# Patient Record
Sex: Female | Born: 1960 | Race: White | Hispanic: No | Smoking: Never smoker
Health system: Southern US, Community
[De-identification: ages and names within clinical notes are randomized; demographics above are authoritative.]

## PROBLEM LIST (undated history)

## (undated) DIAGNOSIS — J45909 Unspecified asthma, uncomplicated: Secondary | ICD-10-CM

## (undated) DIAGNOSIS — E785 Hyperlipidemia, unspecified: Secondary | ICD-10-CM

## (undated) DIAGNOSIS — I10 Essential (primary) hypertension: Secondary | ICD-10-CM

## (undated) DIAGNOSIS — F419 Anxiety disorder, unspecified: Secondary | ICD-10-CM

## (undated) DIAGNOSIS — E039 Hypothyroidism, unspecified: Secondary | ICD-10-CM

## (undated) DIAGNOSIS — E119 Type 2 diabetes mellitus without complications: Secondary | ICD-10-CM

## (undated) DIAGNOSIS — M1990 Unspecified osteoarthritis, unspecified site: Secondary | ICD-10-CM

## (undated) HISTORY — DX: Unspecified osteoarthritis, unspecified site: M19.90

## (undated) HISTORY — DX: Hypothyroidism, unspecified: E03.9

## (undated) HISTORY — PX: ABDOMINAL HYSTERECTOMY: SHX81

## (undated) HISTORY — DX: Hyperlipidemia, unspecified: E78.5

## (undated) HISTORY — PX: TUBAL LIGATION: SHX77

## (undated) HISTORY — PX: ABLATION: SHX5711

---

## 2007-07-31 ENCOUNTER — Ambulatory Visit: Payer: Self-pay

## 2008-08-11 ENCOUNTER — Ambulatory Visit: Payer: Self-pay

## 2009-09-18 HISTORY — PX: ABDOMINAL HYSTERECTOMY: SHX81

## 2009-09-18 HISTORY — PX: ENDOMETRIAL ABLATION: SHX621

## 2009-09-21 ENCOUNTER — Ambulatory Visit: Payer: Self-pay | Admitting: Family Medicine

## 2010-10-26 ENCOUNTER — Ambulatory Visit: Payer: Self-pay

## 2011-11-30 ENCOUNTER — Ambulatory Visit: Payer: Self-pay | Admitting: Family Medicine

## 2013-03-05 ENCOUNTER — Ambulatory Visit: Payer: Self-pay

## 2014-03-17 ENCOUNTER — Emergency Department: Payer: Self-pay | Admitting: Emergency Medicine

## 2014-03-23 ENCOUNTER — Emergency Department: Payer: Self-pay | Admitting: Emergency Medicine

## 2014-06-10 ENCOUNTER — Ambulatory Visit: Payer: Self-pay

## 2015-03-25 ENCOUNTER — Encounter (INDEPENDENT_AMBULATORY_CARE_PROVIDER_SITE_OTHER): Payer: Self-pay

## 2015-03-25 ENCOUNTER — Ambulatory Visit: Payer: Self-pay

## 2016-10-18 ENCOUNTER — Emergency Department
Admission: EM | Admit: 2016-10-18 | Discharge: 2016-10-18 | Disposition: A | Discharge status: Home / Self Care | Payer: Self-pay | Attending: Emergency Medicine | Admitting: Emergency Medicine

## 2016-10-18 ENCOUNTER — Encounter: Payer: Self-pay | Admitting: Emergency Medicine

## 2016-10-18 ENCOUNTER — Emergency Department: Payer: Self-pay

## 2016-10-18 DIAGNOSIS — M1710 Unilateral primary osteoarthritis, unspecified knee: Secondary | ICD-10-CM

## 2016-10-18 DIAGNOSIS — M1712 Unilateral primary osteoarthritis, left knee: Secondary | ICD-10-CM | POA: Insufficient documentation

## 2016-10-18 DIAGNOSIS — J45909 Unspecified asthma, uncomplicated: Secondary | ICD-10-CM | POA: Insufficient documentation

## 2016-10-18 DIAGNOSIS — E119 Type 2 diabetes mellitus without complications: Secondary | ICD-10-CM | POA: Insufficient documentation

## 2016-10-18 DIAGNOSIS — I10 Essential (primary) hypertension: Secondary | ICD-10-CM | POA: Insufficient documentation

## 2016-10-18 DIAGNOSIS — M25562 Pain in left knee: Secondary | ICD-10-CM

## 2016-10-18 HISTORY — DX: Type 2 diabetes mellitus without complications: E11.9

## 2016-10-18 HISTORY — DX: Unspecified asthma, uncomplicated: J45.909

## 2016-10-18 HISTORY — DX: Essential (primary) hypertension: I10

## 2016-10-18 MED ORDER — MELOXICAM 7.5 MG PO TABS: 7.5000 mg | ORAL_TABLET | Freq: Every day | ORAL | 1 refills | Status: AC

## 2016-10-18 NOTE — ED Triage Notes (Signed)
Pt to ED via POV with c/o left knee pain, states she has hx of injury to extremity. Pt states she felt a "pop" Friday night, states pain radiates down leg. Pt c/o swelling to area. Pt ambulatory to traige. A&Ox4, NAD noted

## 2016-10-18 NOTE — ED Provider Notes (Signed)
[facility] Emergency Department Provider Note  ____________________________________________  Time seen: Approximately 4:25 PM  I have reviewed the triage vital signs and the nursing notes.   HISTORY  Chief Complaint Knee Pain    HPI Rachel Valenzuela is a 56 y.o. female presenting to the emergency department with left 8 out of 10 posterior and medial knee pain. Patient states that knee pain started 3 days ago. Patient states that she "felt a pop" at work on Friday. In early December, patient states that she fell on left knee at Christmas time after being tripped by a dog. She denies prior surgeries to the left lower extremity. Patient denies prior DVT, PE, prolonged immobility, recent surgery, smoking, erythema of the skin overlying the left knee or shortness of breath. She has tried ibuprofen but recalls no other alleviating measures. Patient currently works 2 jobs. 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  Past Medical History:  Diagnosis Date  . Asthma   . Diabetes mellitus without complication ([place])   . Hypertension     There are no active problems to display for this patient.   Past Surgical History:  Procedure Laterality Date  . ABDOMINAL HYSTERECTOMY      Prior to Admission medications   Medication Sig Start Date End Date Taking? Authorizing Provider  meloxicam (MOBIC) 7.5 MG tablet Take 1 tablet (7.5 mg total) by mouth daily. 10/18/16 10/25/16  Lannie Fields, PA-C    Allergies Ramipril  No family history on file.  Social History Social History  Substance Use Topics  . Smoking status: Never Smoker  . Smokeless tobacco: Never Used  . Alcohol use No     Review of Systems  Constitutional: No fever/chills Eyes: No visual changes. No discharge ENT: No upper respiratory complaints. Cardiovascular: no chest pain. Respiratory: no cough. No SOB. Gastrointestinal: No abdominal pain.  No nausea, no vomiting.   No diarrhea.  No constipation. Musculoskeletal: Patient has left knee pain. Skin: Negative for rash, abrasions, lacerations, ecchymosis. Neurological: Negative for headaches, focal weakness or numbness. ____________________________________________   PHYSICAL EXAM:  VITAL SIGNS: ED Triage Vitals  Enc Vitals Group     BP 10/18/16 1325 (!) 166/94     Pulse Rate 10/18/16 1324 92     Resp 10/18/16 1324 16     Temp 10/18/16 1324 99.1 F (37.3 C)     Temp Source 10/18/16 1324 Oral     SpO2 10/18/16 1324 99 %     Weight 10/18/16 1325 194 lb (88 kg)     Height 10/18/16 1325 5\' 1"  (1.549 m)     Head Circumference --      Peak Flow --      Pain Score 10/18/16 1325 0     Pain Loc --      Pain Edu? --      Excl. in [place]? --      Constitutional: Alert and oriented. Well appearing and in no acute distress. Neck: FROM.  Cardiovascular: Normal rate, regular rhythm. Normal S1 and S2.  Good peripheral circulation. Respiratory: Normal respiratory effort without tachypnea or retractions. Lungs CTAB. Good air entry to the bases with no decreased or absent breath sounds. Musculoskeletal: To inspection, left knee appears mildly effused. Patient has 5 out of 5 strength of the lower extremities bilaterally. Patient can perform full extension and flexion at the left knee.Crepitus noted to palpation without increased warmth. Palpable left popliteal fullness. She has pain  to palpation along the medial joint line. Negative ballottement, left. Negative anterior and posterior drawer test, left. Some pain is elicited with lateral collateral ligament testing, left.  Neurologic:  Normal speech and language. No gross focal neurologic deficits are appreciated.  Skin:  Skin is warm, dry and intact. No erythema or edema of the skin overlying the left knee. Psychiatric: Mood and affect are normal. Speech and behavior are normal. Patient exhibits appropriate insight and judgement.  ___________________________________    LABS (all labs ordered are listed, but only abnormal results are displayed)  Labs Reviewed - No data to display ____________________________________________  EKG   ____________________________________________  RADIOLOGY Unk Pinto, personally viewed and evaluated these images (plain radiographs) as part of my medical decision making, as well as reviewing the written report by the radiologist.   Dg Knee Complete 4 Views Left  Result Date: 10/18/2016 CLINICAL DATA:  Left knee pain, popping and swelling. EXAM: LEFT KNEE - COMPLETE 4+ VIEW COMPARISON:  None. FINDINGS: Small knee joint effusion. Tricompartmental osteoarthritis with joint space narrowing, most pronounced in the medial compartment. Multiple intra-articular loose bodies. No evidence of fracture. 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 Electronically Signed   By: Nelson Chimes M.D.   On: 10/18/2016 16:41    ____________________________________________    PROCEDURES  Procedure(s) performed:    Procedures    Medications - No data to display   ____________________________________________   INITIAL IMPRESSION / ASSESSMENT AND PLAN / ED COURSE  Pertinent labs & imaging results that were available during my care of the patient were reviewed by me and considered in my medical decision making (see chart for details).  Review of the [place] CSRS was performed in accordance of the [place] prior to dispensing any controlled drugs.     Assessment and Plan: Left Knee Pain Patient presents the emergency department with left knee pain for the past 3 days. DG left knee reveals tricompartmental arthritis with several loose bodies. No acute fractures were visualized. Patient was referred to orthopedics, Dr. Mack Guise. She was discharged with Mobic. All patient questions were answered. ____________________________________________  FINAL CLINICAL IMPRESSION(S) / ED  DIAGNOSES  Final diagnoses:  Acute pain of left knee  Arthritis of knee      NEW MEDICATIONS STARTED DURING THIS VISIT:  New Prescriptions   MELOXICAM (MOBIC) 7.5 MG TABLET    Take 1 tablet (7.5 mg total) by mouth daily.        This chart was dictated using voice recognition software/Dragon. Despite best efforts to proofread, errors can occur which can change the meaning. Any change was purely unintentional.    Lannie Fields, PA-C 10/18/16 [place], MD 10/18/16 [phone]

## 2016-10-18 NOTE — ED Notes (Signed)
See triage note   States she fell couple of months ago  Then felt a pop in left knee   Ambulates with limp d/t pain in left leg

## 2016-11-20 ENCOUNTER — Telehealth: Payer: Self-pay | Admitting: Pharmacist

## 2016-11-20 NOTE — Telephone Encounter (Signed)
11/20/16 Called [facility] for refill on [employer]

## 2016-11-22 ENCOUNTER — Telehealth: Payer: Self-pay | Admitting: Pharmacist

## 2016-11-22 NOTE — Telephone Encounter (Signed)
Called [facility] for refill on [employer]

## 2016-12-29 ENCOUNTER — Telehealth: Payer: Self-pay | Admitting: Pharmacist

## 2016-12-29 NOTE — Telephone Encounter (Signed)
12/29/16 Faxed refill request to [facility] for Prozac 40mg  (1 daily).

## 2017-03-14 ENCOUNTER — Telehealth: Payer: Self-pay | Admitting: Pharmacy Technician

## 2017-03-14 NOTE — Telephone Encounter (Signed)
Patient eligible to receive medication assistance at [facility] until 11/16/17 as long as eligibility requirements continue to be met.  Rachel Valenzuela [facility]

## 2017-03-15 ENCOUNTER — Telehealth: Payer: Self-pay | Admitting: Pharmacist

## 2017-03-15 NOTE — Telephone Encounter (Signed)
03/15/17 Fax refill request to [facility] for Prozac 40mg  take one capsule by mouth daily.Delos Haring

## 2017-07-23 ENCOUNTER — Other Ambulatory Visit: Payer: Self-pay | Admitting: Family Medicine

## 2017-07-23 DIAGNOSIS — Z1239 Encounter for other screening for malignant neoplasm of breast: Secondary | ICD-10-CM

## 2017-08-29 ENCOUNTER — Ambulatory Visit
Admission: RE | Admit: 2017-08-29 | Discharge: 2017-08-29 | Disposition: A | Discharge status: Home / Self Care | Payer: Medicaid Other | Source: Ambulatory Visit | Attending: Family Medicine | Admitting: Family Medicine

## 2017-08-29 DIAGNOSIS — Z1231 Encounter for screening mammogram for malignant neoplasm of breast: Secondary | ICD-10-CM | POA: Diagnosis not present

## 2017-08-29 DIAGNOSIS — R928 Other abnormal and inconclusive findings on diagnostic imaging of breast: Secondary | ICD-10-CM | POA: Insufficient documentation

## 2017-08-29 DIAGNOSIS — Z1239 Encounter for other screening for malignant neoplasm of breast: Secondary | ICD-10-CM

## 2017-09-04 ENCOUNTER — Other Ambulatory Visit: Payer: Self-pay | Admitting: Family Medicine

## 2017-09-04 DIAGNOSIS — R921 Mammographic calcification found on diagnostic imaging of breast: Secondary | ICD-10-CM

## 2017-09-04 DIAGNOSIS — R928 Other abnormal and inconclusive findings on diagnostic imaging of breast: Secondary | ICD-10-CM

## 2017-09-12 ENCOUNTER — Telehealth: Payer: Self-pay | Admitting: Pharmacy Technician

## 2017-09-12 NOTE — Telephone Encounter (Signed)
Patient failed to provide current poi.  No additional medication assistance will be provided by [facility] without the required proof of income documentation.  Patient notified by letter.  Lavana Huckeba J. Tamico Mundo Care Manager [facility] 

## 2017-09-13 ENCOUNTER — Ambulatory Visit
Admission: RE | Admit: 2017-09-13 | Discharge: 2017-09-13 | Disposition: A | Discharge status: Home / Self Care | Payer: Medicaid Other | Source: Ambulatory Visit | Attending: Family Medicine | Admitting: Family Medicine

## 2017-09-13 DIAGNOSIS — R921 Mammographic calcification found on diagnostic imaging of breast: Secondary | ICD-10-CM | POA: Diagnosis not present

## 2017-09-13 DIAGNOSIS — R928 Other abnormal and inconclusive findings on diagnostic imaging of breast: Secondary | ICD-10-CM

## 2017-10-17 DIAGNOSIS — E669 Obesity, unspecified: Secondary | ICD-10-CM | POA: Insufficient documentation

## 2017-10-17 DIAGNOSIS — E119 Type 2 diabetes mellitus without complications: Secondary | ICD-10-CM | POA: Insufficient documentation

## 2017-11-06 DIAGNOSIS — M1711 Unilateral primary osteoarthritis, right knee: Secondary | ICD-10-CM | POA: Insufficient documentation

## 2018-04-10 ENCOUNTER — Other Ambulatory Visit: Payer: Self-pay | Admitting: Family Medicine

## 2018-04-10 DIAGNOSIS — R928 Other abnormal and inconclusive findings on diagnostic imaging of breast: Secondary | ICD-10-CM

## 2018-04-30 ENCOUNTER — Ambulatory Visit
Admission: RE | Admit: 2018-04-30 | Discharge: 2018-04-30 | Disposition: A | Discharge status: Home / Self Care | Payer: Medicaid Other | Source: Ambulatory Visit | Attending: Family Medicine | Admitting: Family Medicine

## 2018-04-30 DIAGNOSIS — R928 Other abnormal and inconclusive findings on diagnostic imaging of breast: Secondary | ICD-10-CM | POA: Diagnosis not present

## 2019-04-22 ENCOUNTER — Other Ambulatory Visit: Payer: Self-pay | Admitting: Family Medicine

## 2019-04-22 DIAGNOSIS — R921 Mammographic calcification found on diagnostic imaging of breast: Secondary | ICD-10-CM

## 2019-09-15 ENCOUNTER — Ambulatory Visit
Admission: RE | Admit: 2019-09-15 | Discharge: 2019-09-15 | Disposition: A | Discharge status: Home / Self Care | Payer: Medicare Other | Source: Ambulatory Visit | Attending: Family Medicine | Admitting: Family Medicine

## 2019-09-15 DIAGNOSIS — R921 Mammographic calcification found on diagnostic imaging of breast: Secondary | ICD-10-CM

## 2020-07-07 ENCOUNTER — Other Ambulatory Visit: Payer: Self-pay | Admitting: Family Medicine

## 2020-07-07 DIAGNOSIS — Z1231 Encounter for screening mammogram for malignant neoplasm of breast: Secondary | ICD-10-CM

## 2020-10-20 ENCOUNTER — Ambulatory Visit
Admission: RE | Admit: 2020-10-20 | Discharge: 2020-10-20 | Disposition: A | Discharge status: Home / Self Care | Payer: Medicare Other | Source: Ambulatory Visit | Attending: Family Medicine | Admitting: Family Medicine

## 2020-10-20 ENCOUNTER — Other Ambulatory Visit: Payer: Self-pay

## 2020-10-20 DIAGNOSIS — Z1231 Encounter for screening mammogram for malignant neoplasm of breast: Secondary | ICD-10-CM | POA: Diagnosis not present

## 2021-01-07 IMAGING — MG DIGITAL DIAGNOSTIC BILAT W/ TOMO W/ CAD
8 of 18 series · 8 of 40 positions shown · non-contrast
Comparison: Previous exam(s).

CLINICAL DATA: 58-year-old female for 2 year follow-up of RIGHT
breast calcifications and for annual bilateral mammogram.

EXAM:
DIGITAL DIAGNOSTIC BILATERAL MAMMOGRAM WITH CAD AND TOMO

[R ML]
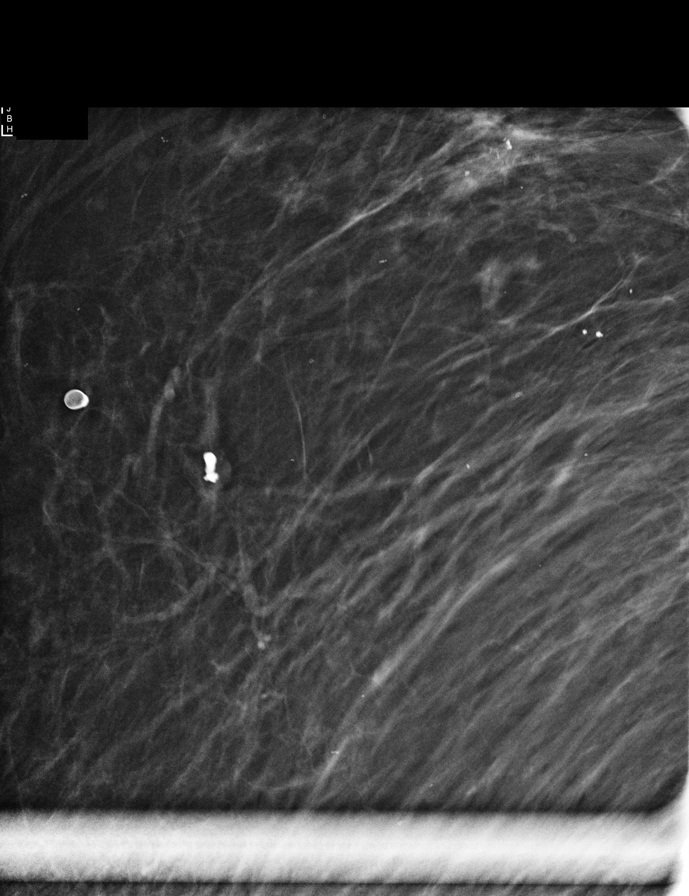

[L XCCL synth-2D]
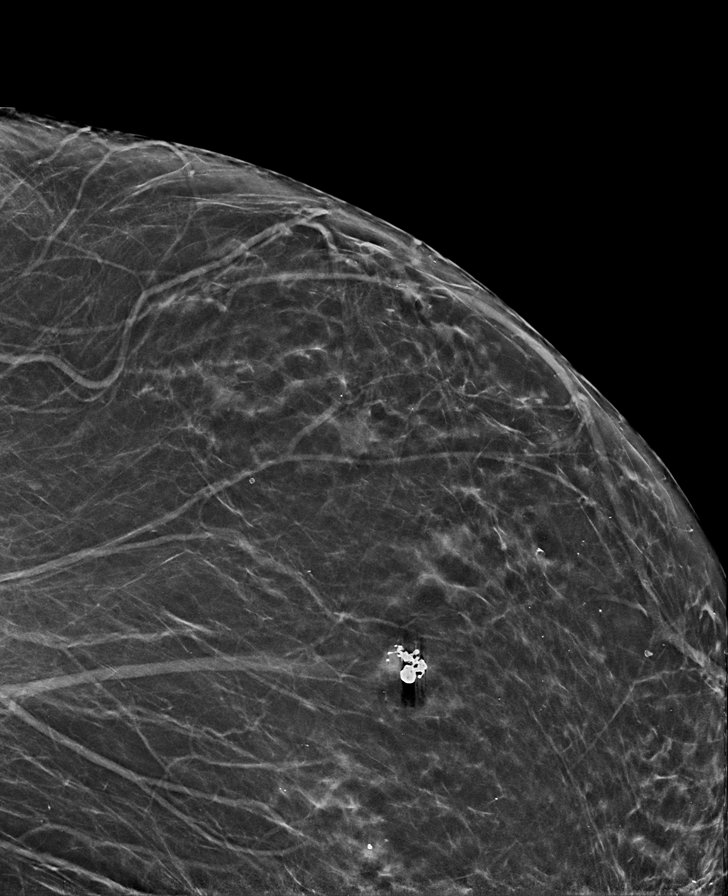

[L MLO synth-2D (1 of 2)]
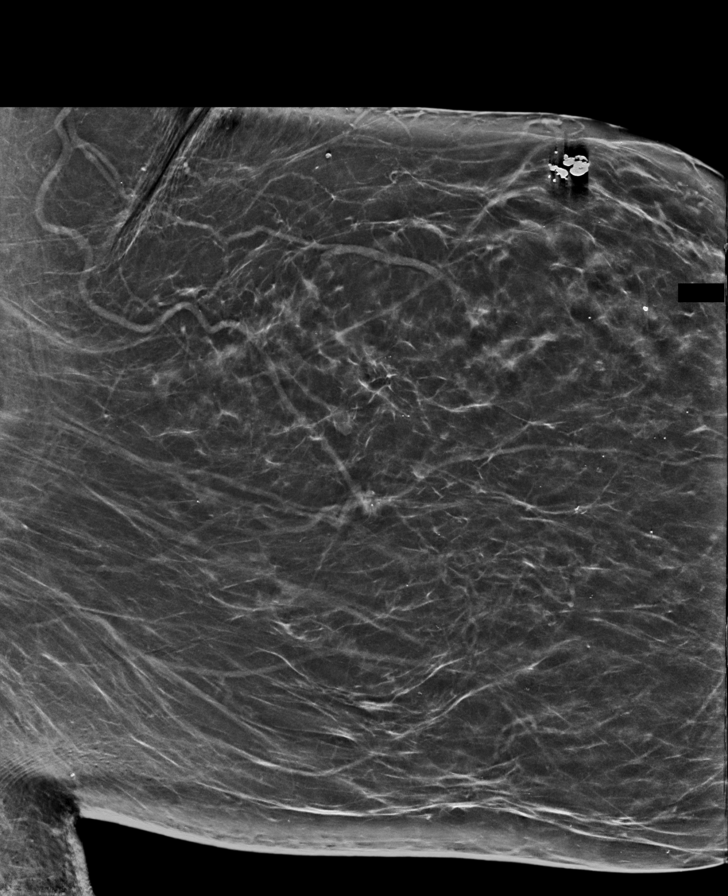

[L MLO synth-2D (2 of 2)]
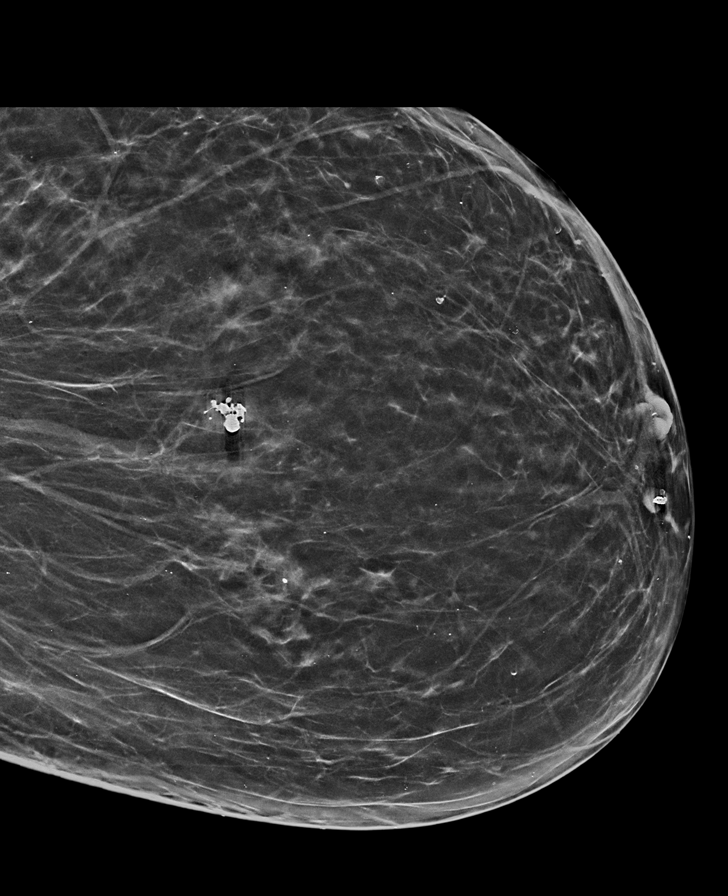

[L CC synth-2D (1 of 2)]
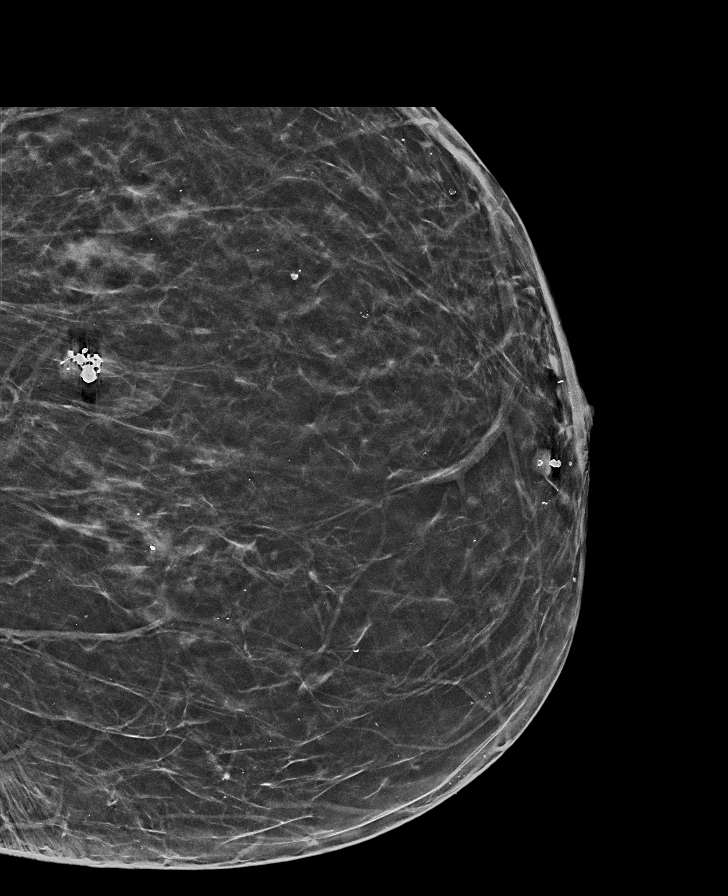

[L CC synth-2D (2 of 2)]
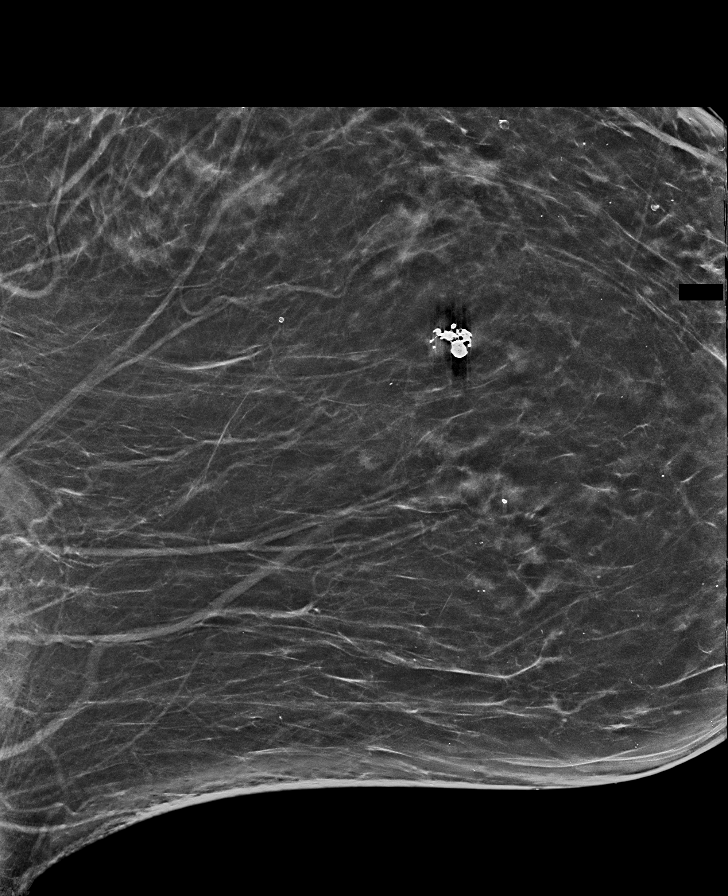

[R XCCL synth-2D]
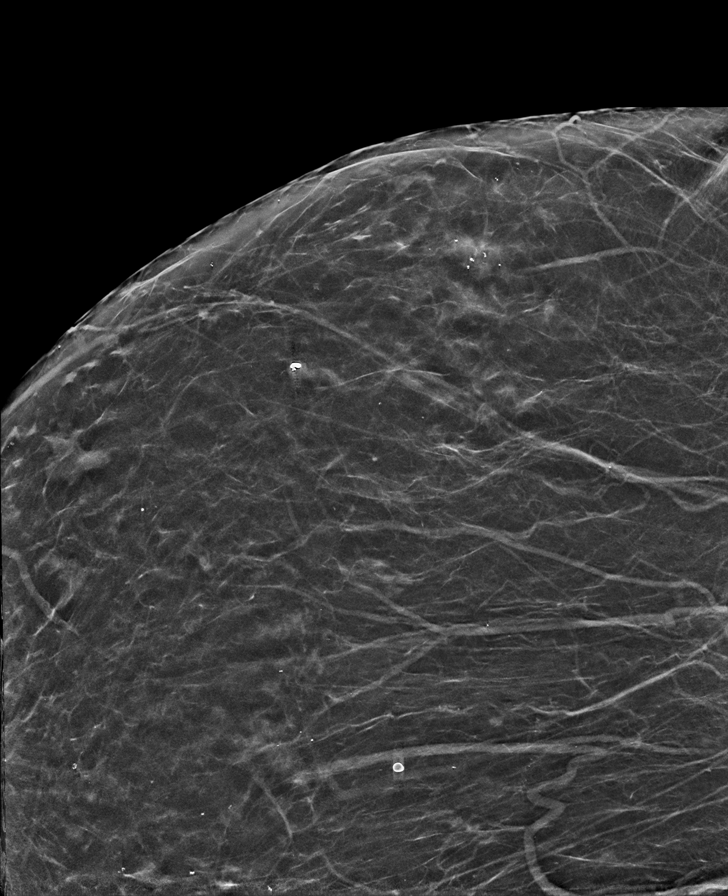

[R MLO synth-2D]
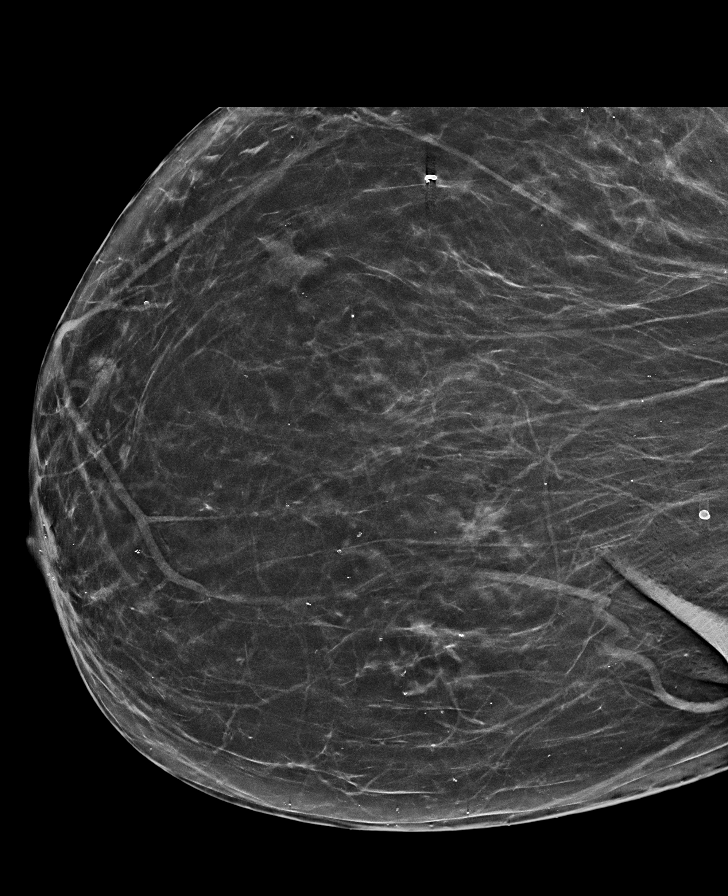

[8 of 40 positions shown; findings below may reference images not displayed]

ACR Breast Density Category b: There are scattered areas of
fibroglandular density.
FINDINGS: 2D/3D full field views of both breasts and magnification views of
the RIGHT breast demonstrate an unchanged 0.8 cm group of
calcifications within the posterior UPPER-OUTER RIGHT breast.

There are no findings suspicious for malignancy within either
breast.

Mammographic images were processed with CAD.
IMPRESSION: 1. Stable calcifications within the UPPER-OUTER RIGHT breast,
unchanged for 2 years and considered benign.
2. No mammographic evidence of breast malignancy.

RECOMMENDATION:
Bilateral screening mammogram in 1 year.

I have discussed the findings and recommendations with the patient.
If applicable, a reminder letter will be sent to the patient
regarding the next appointment.

BI-RADS CATEGORY  2: Benign.

## 2021-03-29 ENCOUNTER — Ambulatory Visit (INDEPENDENT_AMBULATORY_CARE_PROVIDER_SITE_OTHER): Payer: Medicare Other | Admitting: Urology

## 2021-03-29 ENCOUNTER — Encounter: Payer: Self-pay | Admitting: Urology

## 2021-03-29 ENCOUNTER — Other Ambulatory Visit: Payer: Self-pay | Admitting: *Deleted

## 2021-03-29 ENCOUNTER — Other Ambulatory Visit
Admission: RE | Admit: 2021-03-29 | Discharge: 2021-03-29 | Disposition: A | Discharge status: Home / Self Care | Payer: Medicare Other | Attending: Urology | Admitting: Urology

## 2021-03-29 ENCOUNTER — Other Ambulatory Visit: Payer: Self-pay

## 2021-03-29 VITALS — BP 171/91 | HR 86 | Ht 62.0 in | Wt 202.0 lb

## 2021-03-29 DIAGNOSIS — A599 Trichomoniasis, unspecified: Secondary | ICD-10-CM

## 2021-03-29 DIAGNOSIS — N390 Urinary tract infection, site not specified: Secondary | ICD-10-CM

## 2021-03-29 DIAGNOSIS — N3281 Overactive bladder: Secondary | ICD-10-CM | POA: Diagnosis not present

## 2021-03-29 LAB — URINALYSIS, COMPLETE (UACMP) WITH MICROSCOPIC
Bilirubin Urine: NEGATIVE
Glucose, UA: 500 mg/dL — AB
Ketones, ur: NEGATIVE mg/dL
Nitrite: NEGATIVE
Protein, ur: 30 mg/dL — AB
Specific Gravity, Urine: 1.025 (ref 1.005–1.030)
WBC, UA: >50 WBC/hpf (ref 0–5)
pH: 6 (ref 5.0–8.0)

## 2021-03-29 MED ORDER — METRONIDAZOLE 500 MG PO TABS: 2000.0000 mg | ORAL_TABLET | Freq: Three times a day (TID) | ORAL | 0 refills | Status: DC

## 2021-03-29 NOTE — Patient Instructions (Signed)

## 2021-03-29 NOTE — Progress Notes (Signed)
   03/29/21 2:12 PM   Rachel Valenzuela 09-Jun-1961 [id number]  CC: Urinary urgency and frequency  HPI: I saw Rachel Valenzuela in urology clinic for the above issues.  She reports 4 to 5 months of urinary urgency and frequency during the day and night.  She denies any gross hematuria or dysuria.  She has not been sexually active recently.  Urine culture reportedly showed mixed species with her PCP, and she was prescribed antibiotics without any improvement in her urinary symptoms.  She has poorly controlled diabetes with a hemoglobin A1c of 9.3.  She drinks water and diet tea.  Urinalysis today few bacteria, 6-10 RBCs, 11-20 squamous cells, trichomonas present, greater than 50 WBCs, 500 glucose.  Will send for culture.  PMH: Past Medical History:  Diagnosis Date   Asthma    Diabetes mellitus without complication ([place])    Hypertension     Surgical History: Past Surgical History:  Procedure Laterality Date   ABDOMINAL HYSTERECTOMY      Family History: Family History  Problem Relation Age of Onset   Breast cancer Neg Hx     Social History:  reports that she has never smoked. She has never used smokeless tobacco. She reports that she does not drink alcohol and does not use drugs.  Physical Exam: BP (!) 171/91   Pulse 86   Ht 5\' 2"  (1.575 m)   Wt 202 lb (91.6 kg)   BMI 36.95 kg/m    Constitutional:  Alert and oriented, No acute distress. Cardiovascular: No clubbing, cyanosis, or edema. Respiratory: Normal respiratory effort, no increased work of breathing.  Laboratory Data: Reviewed, see HPI  Assessment & Plan:   60 year old female with urinary urgency and frequency likely secondary to trichomonas infection.  We discussed other possible etiologies including poorly controlled diabetes with glucosuria, overactive bladder, or atypical infection.  Will follow-up culture results.  -Metronidazole 2 g x 1 for trichomonas STI -Behavioral strategies discussed at length regarding diabetes  and overactive bladder -RTC 3 to 4 weeks symptom check, consider OAB medication if persistent symptoms  Nickolas Madrid, MD 03/29/2021  [facility] [address] [place], [place] [address] [phone]

## 2021-03-30 ENCOUNTER — Telehealth: Payer: Self-pay

## 2021-03-30 LAB — MISC LABCORP TEST (SEND OUT): LabCorp test name: 86884

## 2021-03-30 NOTE — Telephone Encounter (Signed)
Incoming call from [facility] stating they need clarification on RX for flagyl. Informed pharmacist that Flagyl RX should be for a one time dose of 4 tablets. RX changed.

## 2021-03-31 LAB — URINE CULTURE

## 2021-04-07 ENCOUNTER — Telehealth: Payer: Self-pay

## 2021-04-07 DIAGNOSIS — Z2239 Carrier of other specified bacterial diseases: Secondary | ICD-10-CM

## 2021-04-07 DIAGNOSIS — B3731 Acute candidiasis of vulva and vagina: Secondary | ICD-10-CM

## 2021-04-07 DIAGNOSIS — B373 Candidiasis of vulva and vagina: Secondary | ICD-10-CM

## 2021-04-07 MED ORDER — FLUCONAZOLE 150 MG PO TABS: ORAL_TABLET | ORAL | 0 refills | Status: DC

## 2021-04-07 MED ORDER — DOXYCYCLINE HYCLATE 100 MG PO CAPS: 100.0000 mg | ORAL_CAPSULE | Freq: Two times a day (BID) | ORAL | 0 refills | Status: AC

## 2021-04-07 NOTE — Telephone Encounter (Signed)
Called pt informed her of the information below. Pt gave verbal understanding. RX sent. Additionally pt requests diflucan for vaginal yeast infection after several courses of antibiotics. RX sent.

## 2021-04-07 NOTE — Telephone Encounter (Signed)
-----   Message from Billey Co, MD sent at 04/06/2021  3:08 PM EDT ----- Regarding: abx for ureaplasma Let do doxycycline 100mg  BID x 10 days for the Ureaplasma UTI, thanks  Nickolas Madrid, MD 04/06/2021

## 2021-04-21 ENCOUNTER — Ambulatory Visit (INDEPENDENT_AMBULATORY_CARE_PROVIDER_SITE_OTHER): Payer: Medicare Other | Admitting: Physician Assistant

## 2021-04-21 ENCOUNTER — Other Ambulatory Visit: Payer: Self-pay

## 2021-04-21 VITALS — BP 195/101 | HR 98 | Ht 62.0 in | Wt 200.0 lb

## 2021-04-21 DIAGNOSIS — R350 Frequency of micturition: Secondary | ICD-10-CM

## 2021-04-21 LAB — BLADDER SCAN AMB NON-IMAGING

## 2021-04-21 MED ORDER — MIRABEGRON ER 25 MG PO TB24: 25.0000 mg | ORAL_TABLET | Freq: Every day | ORAL | 0 refills | Status: DC

## 2021-04-21 NOTE — Progress Notes (Signed)
04/21/2021 2:32 PM   Rachel Valenzuela [birth year] [id number]  CC: Chief Complaint  Patient presents with   Follow-up   HPI: Rachel Valenzuela is a 60 y.o. female with PMH poorly controlled diabetes, urgency and frequency who was recently treated for trichomonas and Ureaplasma UTI who presents today for symptom recheck.   Today she reports no improvement in her urinary symptoms despite treatment as above.  She continues to report urinary frequency every 1-2 hours throughout the day and night associated with urgency and insensate leakage, for which she wears liners daily.  She denies bulging or pressure in the vagina.  She states her urinary symptoms started within the last 6 to 12 months and are not persistent, as she will have some days where she does not have urgency.  She does not think her earlier trichomonas diagnosis was accurate as she had not been sexually active in 1 year.  In-office UA today positive for 2+ glucose; urine microscopy pan negative. PVR 22m.  PMH: Past Medical History:  Diagnosis Date   Asthma    Diabetes mellitus without complication ([place]    Hypertension     Surgical History: Past Surgical History:  Procedure Laterality Date   ABDOMINAL HYSTERECTOMY      Home Medications:  Allergies as of 04/21/2021       Reactions   Ramipril Anaphylaxis, Hives   Sulfa Antibiotics    Other reaction(s): Other (See Comments)        Medication List        Accurate as of April 21, 2021  2:32 PM. If you have any questions, ask your nurse or doctor.          amLODipine 10 MG tablet Commonly known as: NORVASC Take 10 mg by mouth daily.   atenolol 100 MG tablet Commonly known as: TENORMIN Take 100 mg by mouth daily.   atorvastatin 80 MG tablet Commonly known as: LIPITOR Take by mouth daily.   azelastine 0.1 % nasal spray Commonly known as: ASTELIN SMARTSIG:1-2 Puff(s) Both Nares Twice Daily   Breo Ellipta 200-25 MCG/INH Aepb Generic drug: fluticasone  furoate-vilanterol Inhale 1 puff into the lungs daily.   cyclobenzaprine 5 MG tablet Commonly known as: FLEXERIL Take 5 mg by mouth 3 (three) times daily as needed for muscle spasms.   fluconazole 150 MG tablet Commonly known as: DIFLUCAN Take 1 tablet by mouth with first dose of antibiotics, take second tablet with last dose   FLUoxetine 40 MG capsule Commonly known as: PROZAC Take 40 mg by mouth daily.   hydrALAZINE 100 MG tablet Commonly known as: APRESOLINE Take 100 mg by mouth 2 (two) times daily.   ibuprofen 800 MG tablet Commonly known as: ADVIL Take by mouth as needed.   Januvia 100 MG tablet Generic drug: sitaGLIPtin Take 100 mg by mouth daily.   latanoprost 0.005 % ophthalmic solution Commonly known as: XALATAN 1 drop 2 (two) times daily.   levocetirizine 5 MG tablet Commonly known as: XYZAL Take 5 mg by mouth daily.   levothyroxine 150 MCG tablet Commonly known as: SYNTHROID Take 150 mcg by mouth daily.   meclizine 25 MG tablet Commonly known as: ANTIVERT Take by mouth 3 (three) times daily as needed.   meloxicam 15 MG tablet Commonly known as: MOBIC Take 15 mg by mouth daily.   metroNIDAZOLE 500 MG tablet Commonly known as: FLAGYL Take 4 tablets (2,000 mg total) by mouth 3 (three) times daily.   mirabegron ER 25 MG Tb24  tablet Commonly known as: MYRBETRIQ Take 1 tablet (25 mg total) by mouth daily. Started by: Debroah Loop, PA-C   montelukast 10 MG tablet Commonly known as: SINGULAIR Take 10 mg by mouth daily.   ProAir HFA 108 (90 Base) MCG/ACT inhaler Generic drug: albuterol Inhale into the lungs as needed.   Simbrinza 1-0.2 % Susp Generic drug: Brinzolamide-Brimonidine Apply 1 drop to eye 2 (two) times daily.   traZODone 100 MG tablet Commonly known as: DESYREL Take 200 mg by mouth at bedtime as needed.   Trulicity 1.5 0000000 Sopn Generic drug: Dulaglutide Inject into the skin once a week.   Vitamin D 50 MCG (2000  UT) Caps Take by mouth daily.        Allergies:  Allergies  Allergen Reactions   Ramipril Anaphylaxis and Hives   Sulfa Antibiotics     Other reaction(s): Other (See Comments)    Family History: Family History  Problem Relation Age of Onset   Breast cancer Neg Hx     Social History:   reports that she has never smoked. She has never used smokeless tobacco. She reports that she does not drink alcohol and does not use drugs.  Physical Exam: BP (!) 195/101   Pulse 98   Ht '5\' 2"'$  (1.575 m)   Wt 200 lb (90.7 kg)   BMI 36.58 kg/m   Constitutional:  Alert and oriented, no acute distress, nontoxic appearing HEENT: Rachel Valenzuela, AT Cardiovascular: No clubbing, cyanosis, or edema Respiratory: Normal respiratory effort, no increased work of breathing Skin: No rashes, bruises or suspicious lesions Neurologic: Grossly intact, no focal deficits, moving all 4 extremities Psychiatric: Normal mood and affect  Laboratory Data: Results for orders placed or performed in visit on 04/21/21  Microscopic Examination   Urine  Result Value Ref Range   WBC, UA 0-5 0 - 5 /hpf   RBC 0-2 0 - 2 /hpf   Epithelial Cells (non renal) 0-10 0 - 10 /hpf   Bacteria, UA Few None seen/Few  Urinalysis, Complete  Result Value Ref Range   Specific Gravity, UA 1.025 1.005 - 1.030   pH, UA 6.0 5.0 - 7.5   Color, UA Yellow Yellow   Appearance Ur Hazy (A) Clear   Leukocytes,UA Negative Negative   Protein,UA Negative Negative/Trace   Glucose, UA 2+ (A) Negative   Ketones, UA Negative Negative   RBC, UA Negative Negative   Bilirubin, UA Negative Negative   Urobilinogen, Ur 0.2 0.2 - 1.0 mg/dL   Nitrite, UA Negative Negative   Microscopic Examination See below:   Bladder Scan (Post Void Residual) in office  Result Value Ref Range   Scan Result 65m    Assessment & Plan:   1. Urinary frequency No symptomatic improvement despite appropriate therapy for trichomonas and Ureaplasma UTI.  Notably, UA is benign  today and she is emptying appropriately.  I offered her a trial of Myrbetriq 25 mg today, which she accepted.  We will plan for symptom recheck and PVR in 4 weeks. - Urinalysis, Complete - Bladder Scan (Post Void Residual) in office - mirabegron ER (MYRBETRIQ) 25 MG TB24 tablet; Take 1 tablet (25 mg total) by mouth daily.  Dispense: 28 tablet; Refill: 0  Return in about 4 weeks (around 05/19/2021) for Symptom recheck with PVR.  SDebroah Loop PA-C  [facility] [address] [place] [phone]

## 2021-04-22 LAB — MICROSCOPIC EXAMINATION

## 2021-04-22 LAB — URINALYSIS, COMPLETE
Bilirubin, UA: NEGATIVE
Ketones, UA: NEGATIVE
Leukocytes,UA: NEGATIVE
Nitrite, UA: NEGATIVE
Protein,UA: NEGATIVE
RBC, UA: NEGATIVE
Specific Gravity, UA: 1.025 (ref 1.005–1.030)
Urobilinogen, Ur: 0.2 mg/dL (ref 0.2–1.0)
pH, UA: 6 (ref 5.0–7.5)

## 2021-05-19 ENCOUNTER — Other Ambulatory Visit: Payer: Self-pay

## 2021-05-19 ENCOUNTER — Ambulatory Visit (INDEPENDENT_AMBULATORY_CARE_PROVIDER_SITE_OTHER): Payer: Medicare Other | Admitting: Physician Assistant

## 2021-05-19 VITALS — BP 95/59 | HR 113 | Ht 62.0 in | Wt 202.0 lb

## 2021-05-19 DIAGNOSIS — R350 Frequency of micturition: Secondary | ICD-10-CM

## 2021-05-19 LAB — BLADDER SCAN AMB NON-IMAGING

## 2021-05-19 MED ORDER — MIRABEGRON ER 25 MG PO TB24: 25.0000 mg | ORAL_TABLET | Freq: Every day | ORAL | 11 refills | Status: DC

## 2021-05-19 NOTE — Progress Notes (Signed)
05/19/2021 5:30 PM   Rachel Valenzuela July 17, 1961 [id number]  CC: Chief Complaint  Patient presents with   Urinary Frequency   HPI: Rachel Valenzuela is a 60 y.o. female with PMH poorly controlled diabetes, urgency, and frequency recently treated for trichomonas and Ureaplasma UTI who presents today for symptom recheck on Myrbetriq 25 mg daily.  Today she reports she feels the Myrbetriq improved her urinary symptoms on some days, but others she felt back to baseline.  At her best, she states her daytime frequency reduced from every 1-2 hours to every 4-5 hours.  Her nocturia has improved from every 1-2 hours to 1-2 times nightly.  She has noted some intermittent lower abdominal pressure and denies constipation.  Per chart review she is s/p abdominal hysterectomy, date unknown.  PVR 113 mL.  Notably, patient was noted to be hypotensive in clinic today.  She reports she started a new antihypertensive medication this morning but otherwise feels well.  PMH: Past Medical History:  Diagnosis Date   Asthma    Diabetes mellitus without complication ([place])    Hypertension     Surgical History: Past Surgical History:  Procedure Laterality Date   ABDOMINAL HYSTERECTOMY      Home Medications:  Allergies as of 05/19/2021       Reactions   Ramipril Anaphylaxis, Hives   Sulfa Antibiotics    Other reaction(s): Other (See Comments)        Medication List        Accurate as of May 19, 2021  5:30 PM. If you have any questions, ask your nurse or doctor.          amLODipine 10 MG tablet Commonly known as: NORVASC Take 10 mg by mouth daily.   atenolol 100 MG tablet Commonly known as: TENORMIN Take 100 mg by mouth daily.   atorvastatin 80 MG tablet Commonly known as: LIPITOR Take by mouth daily.   azelastine 0.1 % nasal spray Commonly known as: ASTELIN SMARTSIG:1-2 Puff(s) Both Nares Twice Daily   Breo Ellipta 200-25 MCG/INH Aepb Generic drug: fluticasone  furoate-vilanterol Inhale 1 puff into the lungs daily.   cyclobenzaprine 5 MG tablet Commonly known as: FLEXERIL Take 5 mg by mouth 3 (three) times daily as needed for muscle spasms.   fluconazole 150 MG tablet Commonly known as: DIFLUCAN Take 1 tablet by mouth with first dose of antibiotics, take second tablet with last dose   FLUoxetine 40 MG capsule Commonly known as: PROZAC Take 40 mg by mouth daily.   hydrALAZINE 100 MG tablet Commonly known as: APRESOLINE Take 100 mg by mouth 2 (two) times daily.   ibuprofen 800 MG tablet Commonly known as: ADVIL Take by mouth as needed.   Januvia 100 MG tablet Generic drug: sitaGLIPtin Take 100 mg by mouth daily.   latanoprost 0.005 % ophthalmic solution Commonly known as: XALATAN 1 drop 2 (two) times daily.   levocetirizine 5 MG tablet Commonly known as: XYZAL Take 5 mg by mouth daily.   levothyroxine 150 MCG tablet Commonly known as: SYNTHROID Take 150 mcg by mouth daily.   meclizine 25 MG tablet Commonly known as: ANTIVERT Take by mouth 3 (three) times daily as needed.   meloxicam 15 MG tablet Commonly known as: MOBIC Take 15 mg by mouth daily.   metroNIDAZOLE 500 MG tablet Commonly known as: FLAGYL Take 4 tablets (2,000 mg total) by mouth 3 (three) times daily.   mirabegron ER 25 MG Tb24 tablet Commonly known as: MYRBETRIQ Take  1 tablet (25 mg total) by mouth daily.   montelukast 10 MG tablet Commonly known as: SINGULAIR Take 10 mg by mouth daily.   ProAir HFA 108 (90 Base) MCG/ACT inhaler Generic drug: albuterol Inhale into the lungs as needed.   Simbrinza 1-0.2 % Susp Generic drug: Brinzolamide-Brimonidine Apply 1 drop to eye 2 (two) times daily.   traZODone 100 MG tablet Commonly known as: DESYREL Take 200 mg by mouth at bedtime as needed.   Trulicity 1.5 0000000 Sopn Generic drug: Dulaglutide Inject into the skin once a week.   Vitamin D 50 MCG (2000 UT) Caps Take by mouth daily.         Allergies:  Allergies  Allergen Reactions   Ramipril Anaphylaxis and Hives   Sulfa Antibiotics     Other reaction(s): Other (See Comments)    Family History: Family History  Problem Relation Age of Onset   Breast cancer Neg Hx     Social History:   reports that she has never smoked. She has never used smokeless tobacco. She reports that she does not drink alcohol and does not use drugs.  Physical Exam: BP (!) 95/59   Pulse (!) 113   Ht '5\' 2"'$  (1.575 m)   Wt 202 lb (91.6 kg)   BMI 36.95 kg/m   Constitutional:  Alert and oriented, no acute distress, nontoxic appearing HEENT: [place], AT Cardiovascular: No clubbing, cyanosis, or edema Respiratory: Normal respiratory effort, no increased work of breathing Skin: No rashes, bruises or suspicious lesions Neurologic: Grossly intact, no focal deficits, moving all 4 extremities Psychiatric: Normal mood and affect  Laboratory Data: Results for orders placed or performed in visit on 05/19/21  Bladder Scan (Post Void Residual) in office  Result Value Ref Range   Scan Result 138m    Assessment & Plan:   1. Urinary frequency Symptomatic improvement on Myrbetriq 25 mg daily, but patient is not yet at treatment goal.  We discussed continuing Myrbetriq 25 mg to see if her symptoms continue to improve versus increasing the dose, however given her slight increase in PVR today over prior, I recommended the former.  Patient is in agreement with this plan.  We will plan to recheck her symptoms in 3 months.  If still not at treatment goal, will increase the dose to 50 mg. - Bladder Scan (Post Void Residual) in office - mirabegron ER (MYRBETRIQ) 25 MG TB24 tablet; Take 1 tablet (25 mg total) by mouth daily.  Dispense: 30 tablet; Refill: 11  Return in about 3 months (around 08/18/2021) for Symptom recheck with PVR.  SDebroah Loop PA-C  [facility] [address] [place] [phone]

## 2021-06-15 ENCOUNTER — Other Ambulatory Visit: Payer: Self-pay | Admitting: Family Medicine

## 2021-06-15 DIAGNOSIS — E782 Mixed hyperlipidemia: Secondary | ICD-10-CM

## 2021-06-15 DIAGNOSIS — E118 Type 2 diabetes mellitus with unspecified complications: Secondary | ICD-10-CM

## 2021-06-15 DIAGNOSIS — I10 Essential (primary) hypertension: Secondary | ICD-10-CM

## 2021-06-23 ENCOUNTER — Other Ambulatory Visit: Payer: Self-pay

## 2021-06-23 ENCOUNTER — Ambulatory Visit
Admission: RE | Admit: 2021-06-23 | Discharge: 2021-06-23 | Disposition: A | Discharge status: Home / Self Care | Payer: Medicare Other | Source: Ambulatory Visit | Attending: Family Medicine | Admitting: Family Medicine

## 2021-06-23 DIAGNOSIS — E118 Type 2 diabetes mellitus with unspecified complications: Secondary | ICD-10-CM | POA: Diagnosis present

## 2021-06-23 DIAGNOSIS — E782 Mixed hyperlipidemia: Secondary | ICD-10-CM | POA: Insufficient documentation

## 2021-06-23 DIAGNOSIS — I10 Essential (primary) hypertension: Secondary | ICD-10-CM | POA: Diagnosis present

## 2021-08-18 ENCOUNTER — Ambulatory Visit: Payer: Medicare Other | Admitting: Physician Assistant

## 2021-08-23 ENCOUNTER — Ambulatory Visit: Payer: Medicare Other | Admitting: Physician Assistant

## 2021-08-30 ENCOUNTER — Ambulatory Visit: Payer: Medicare Other | Admitting: Physician Assistant

## 2021-09-05 ENCOUNTER — Ambulatory Visit: Payer: Medicare Other | Admitting: Physician Assistant

## 2021-09-23 ENCOUNTER — Ambulatory Visit: Payer: Medicare Other | Admitting: Physician Assistant

## 2021-10-03 NOTE — Progress Notes (Signed)
10/04/2021 11:03 AM   [name] 07-24-1961 [id number]  Referring provider: Marguerita Merles, [address] [place],  [place] [address]  Chief Complaint  Patient presents with   Urinary Frequency   Urological history: 1. STI -trichomonas 03/2021  2. OAB -Contributing factors of age, vaginal atrophy, diabetes and artificial sweeteners -PVR 0 mL   HPI: ARAIYA TILMON is a 61 y.o. female who presents today for follow up after a trial of Myrbetriq 25 mg daily.  PVR 0 mL   She is having 8 or more daytime urinations, 3 or more nighttime urinations with a mild urge to urinate.  She has minimal volume leakage 1-2 times weekly.  She does wear an absorbent pad daily.  Patient denies any modifying or aggravating factors.  Patient denies any gross hematuria, dysuria or suprapubic/flank pain.  Patient denies any fevers, chills, nausea or vomiting.    She states her urinary issues vary day to day.  She states her sugars are doing better with ranges of 120-200.  She cannot test every day due to expense of the test strips.    PMH: Past Medical History:  Diagnosis Date   Asthma    Diabetes mellitus without complication ([place])    Hypertension     Surgical History: Past Surgical History:  Procedure Laterality Date   ABDOMINAL HYSTERECTOMY      Home Medications:  Allergies as of 10/04/2021       Reactions   Ramipril Anaphylaxis, Hives   Sulfa Antibiotics    Other reaction(s): Other (See Comments)        Medication List        Accurate as of October 04, 2021 11:59 PM. If you have any questions, ask your nurse or doctor.          amLODipine 10 MG tablet Commonly known as: NORVASC Take 10 mg by mouth daily.   atenolol 100 MG tablet Commonly known as: TENORMIN Take 100 mg by mouth daily.   atorvastatin 80 MG tablet Commonly known as: LIPITOR Take by mouth daily.   azelastine 0.1 % nasal spray Commonly known as: ASTELIN SMARTSIG:1-2 Puff(s) Both Nares  Twice Daily   Breo Ellipta 200-25 MCG/ACT Aepb Generic drug: fluticasone furoate-vilanterol Inhale 1 puff into the lungs daily.   cyclobenzaprine 5 MG tablet Commonly known as: FLEXERIL Take 5 mg by mouth 3 (three) times daily as needed for muscle spasms.   fluconazole 150 MG tablet Commonly known as: DIFLUCAN Take 1 tablet by mouth with first dose of antibiotics, take second tablet with last dose   FLUoxetine 40 MG capsule Commonly known as: PROZAC Take 40 mg by mouth daily.   hydrALAZINE 100 MG tablet Commonly known as: APRESOLINE Take 100 mg by mouth 2 (two) times daily.   ibuprofen 800 MG tablet Commonly known as: ADVIL Take by mouth as needed.   Januvia 100 MG tablet Generic drug: sitaGLIPtin Take 100 mg by mouth daily.   latanoprost 0.005 % ophthalmic solution Commonly known as: XALATAN 1 drop 2 (two) times daily.   levocetirizine 5 MG tablet Commonly known as: XYZAL Take 5 mg by mouth daily.   levothyroxine 150 MCG tablet Commonly known as: SYNTHROID Take 150 mcg by mouth daily.   meclizine 25 MG tablet Commonly known as: ANTIVERT Take by mouth 3 (three) times daily as needed.   meloxicam 15 MG tablet Commonly known as: MOBIC Take 15 mg by mouth daily.   metroNIDAZOLE 500 MG tablet Commonly  known as: FLAGYL Take 4 tablets (2,000 mg total) by mouth 3 (three) times daily.   mirabegron ER 25 MG Tb24 tablet Commonly known as: MYRBETRIQ Take 1 tablet (25 mg total) by mouth daily.   montelukast 10 MG tablet Commonly known as: SINGULAIR Take 10 mg by mouth daily.   ProAir HFA 108 (90 Base) MCG/ACT inhaler Generic drug: albuterol Inhale into the lungs as needed.   Simbrinza 1-0.2 % Susp Generic drug: Brinzolamide-Brimonidine Apply 1 drop to eye 2 (two) times daily.   traZODone 100 MG tablet Commonly known as: DESYREL Take 200 mg by mouth at bedtime as needed.   Trulicity 1.5 EZ/6.6QH Sopn Generic drug: Dulaglutide Inject into the skin once a  week.   Vitamin D 50 MCG (2000 UT) Caps Take by mouth daily.        Allergies:  Allergies  Allergen Reactions   Ramipril Anaphylaxis and Hives   Sulfa Antibiotics     Other reaction(s): Other (See Comments)    Family History: Family History  Problem Relation Age of Onset   Breast cancer Neg Hx     Social History:  reports that she has never smoked. She has never used smokeless tobacco. She reports that she does not drink alcohol and does not use drugs.  ROS: Pertinent ROS in HPI  Physical Exam: BP (!) 172/83    Pulse 90    Ht 5\' 1"  (1.549 m)    Wt 202 lb (91.6 kg)    BMI 38.17 kg/m   Constitutional:  Well nourished. Alert and oriented, No acute distress. HEENT: [place] AT, mask in place.  Trachea midline Cardiovascular: No clubbing, cyanosis, or edema. Respiratory: Normal respiratory effort, no increased work of breathing. Neurologic: Grossly intact, no focal deficits, moving all 4 extremities. Psychiatric: Normal mood and affect.    Laboratory Data: N/A   I have reviewed the labs.   Pertinent Imaging:  10/04/21 14:36  Scan Result 0     Assessment & Plan:    1. OAB -not at goal with Myrbetriq 25 mg daily -will increase to Myrbetriq 50 mg daily -discussed how diabetes contributes to her urinary symptoms, but due to financial matters she cannot test daily  Return in about 3 weeks (around 10/25/2021) for PVR and OAB questionnaire.  These notes generated with voice recognition software. I apologize for typographical errors.  Zara Council, PA-C  [facility] [address]  [place],  [address] [phone]

## 2021-10-04 ENCOUNTER — Encounter: Payer: Self-pay | Admitting: Urology

## 2021-10-04 ENCOUNTER — Other Ambulatory Visit: Payer: Self-pay

## 2021-10-04 ENCOUNTER — Ambulatory Visit (INDEPENDENT_AMBULATORY_CARE_PROVIDER_SITE_OTHER): Payer: Medicare Other | Admitting: Urology

## 2021-10-04 VITALS — BP 172/83 | HR 90 | Ht 61.0 in | Wt 202.0 lb

## 2021-10-04 DIAGNOSIS — N3281 Overactive bladder: Secondary | ICD-10-CM

## 2021-10-04 LAB — BLADDER SCAN AMB NON-IMAGING: Scan Result: 0

## 2021-10-26 ENCOUNTER — Ambulatory Visit: Payer: Medicare Other | Admitting: Urology

## 2021-11-22 ENCOUNTER — Other Ambulatory Visit: Payer: Self-pay | Admitting: Family Medicine

## 2021-11-22 DIAGNOSIS — Z1231 Encounter for screening mammogram for malignant neoplasm of breast: Secondary | ICD-10-CM

## 2021-11-22 NOTE — Progress Notes (Signed)
11/23/2021 2:58 PM   [name] 02-23-1961 [id number]  Referring provider: Marguerita Merles, [place] [address] [place],  [place] [address]  Chief Complaint  Patient presents with   Over Active Bladder   Urological history: 1. STI -trichomonas 03/2021  2. OAB -Contributing factors of age, vaginal atrophy, diabetes and artificial sweeteners -PVR 0 mL   HPI: Rachel Valenzuela is a 61 y.o. female who presents today for follow up after a trial of Myrbetriq 50 mg daily.  She endorses 8 or more daytime urinations, nocturia x3 with a mild urge to urinate.  She is experiencing stress urinary incontinence and occasional urge incontinence.  She is having urinary leakage 3 more times daily and wears absorbent pads daily.  She does engage in toilet mapping.  PVR 0 mL   She states she feels the Myrbetriq 50 mg helped more than the 25 mg but she still experiencing urgency and frequency.  She states that she just use the restroom a few minutes ago and she feels like she should go again.    She is also having some discomfort in the right lower quadrant and lower back.    She is also had instances when she wiped she noticed blood on the toilet tissue, but she denied any gross hematuria or symptoms of urinary tract infection.  She also states that she is always told she has trace blood in her urine when she goes to her PCP which I imagine are urine dips.  She also denied any vaginal discharge or feeling a bulge when wiping.  She is aware that she has hemorrhoids, so she is sure that the blood came from her front area and not that external hemorrhoids.  Patient denies any modifying or aggravating factors.  Patient denies any dysuria or suprapubic/flank pain.  Patient denies any fevers, chills, nausea or vomiting.     PMH: Past Medical History:  Diagnosis Date   Asthma    Diabetes mellitus without complication ([place])    Hypertension     Surgical History: Past Surgical History:  Procedure  Laterality Date   ABDOMINAL HYSTERECTOMY      Home Medications:  Allergies as of 11/23/2021       Reactions   Ramipril Anaphylaxis, Hives   Sulfa Antibiotics    Other reaction(s): Other (See Comments)        Medication List        Accurate as of November 23, 2021  2:58 PM. If you have any questions, ask your nurse or doctor.          STOP taking these medications    fluconazole 150 MG tablet Commonly known as: DIFLUCAN Stopped by: Zara Council, PA-C       TAKE these medications    amLODipine 10 MG tablet Commonly known as: NORVASC Take 10 mg by mouth daily.   atenolol 100 MG tablet Commonly known as: TENORMIN Take 100 mg by mouth daily.   atorvastatin 80 MG tablet Commonly known as: LIPITOR Take by mouth daily.   azelastine 0.1 % nasal spray Commonly known as: ASTELIN SMARTSIG:1-2 Puff(s) Both Nares Twice Daily   Breo Ellipta 200-25 MCG/ACT Aepb Generic drug: fluticasone furoate-vilanterol Inhale 1 puff into the lungs daily.   cyclobenzaprine 5 MG tablet Commonly known as: FLEXERIL Take 5 mg by mouth 3 (three) times daily as needed for muscle spasms.   FLUoxetine 40 MG capsule Commonly known as: PROZAC Take 40 mg by mouth daily.  hydrALAZINE 100 MG tablet Commonly known as: APRESOLINE Take 100 mg by mouth 2 (two) times daily.   ibuprofen 800 MG tablet Commonly known as: ADVIL Take by mouth as needed.   Januvia 100 MG tablet Generic drug: sitaGLIPtin Take 100 mg by mouth daily.   latanoprost 0.005 % ophthalmic solution Commonly known as: XALATAN 1 drop 2 (two) times daily.   levocetirizine 5 MG tablet Commonly known as: XYZAL Take 5 mg by mouth daily.   levothyroxine 150 MCG tablet Commonly known as: SYNTHROID Take 150 mcg by mouth daily.   meclizine 25 MG tablet Commonly known as: ANTIVERT Take by mouth 3 (three) times daily as needed.   meloxicam 15 MG tablet Commonly known as: MOBIC Take 15 mg by mouth daily.    metroNIDAZOLE 500 MG tablet Commonly known as: FLAGYL Take 4 tablets (2,000 mg total) by mouth 3 (three) times daily.   mirabegron ER 25 MG Tb24 tablet Commonly known as: MYRBETRIQ Take 1 tablet (25 mg total) by mouth daily.   montelukast 10 MG tablet Commonly known as: SINGULAIR Take 10 mg by mouth daily.   ProAir HFA 108 (90 Base) MCG/ACT inhaler Generic drug: albuterol Inhale into the lungs as needed.   Simbrinza 1-0.2 % Susp Generic drug: Brinzolamide-Brimonidine Apply 1 drop to eye 2 (two) times daily.   traZODone 100 MG tablet Commonly known as: DESYREL Take 200 mg by mouth at bedtime as needed.   Trulicity 1.5 GH/8.2XH Sopn Generic drug: Dulaglutide Inject into the skin once a week.   Vitamin D 50 MCG (2000 UT) Caps Take by mouth daily.        Allergies:  Allergies  Allergen Reactions   Ramipril Anaphylaxis and Hives   Sulfa Antibiotics     Other reaction(s): Other (See Comments)    Family History: Family History  Problem Relation Age of Onset   Breast cancer Neg Hx     Social History:  reports that she has never smoked. She has never used smokeless tobacco. She reports that she does not drink alcohol and does not use drugs.  ROS: Pertinent ROS in HPI  Physical Exam: BP (!) 228/104    Pulse 87    Ht '5\' 2"'$  (1.575 m)    Wt 199 lb (90.3 kg)    BMI 36.40 kg/m   Constitutional:  Well nourished. Alert and oriented, No acute distress. HEENT: [place] AT, mask in place.  Trachea midline Cardiovascular: No clubbing, cyanosis, or edema. Respiratory: Normal respiratory effort, no increased work of breathing. GU: No CVA tenderness.  No bladder fullness or masses.  Atrophic external genitalia, normal pubic hair distribution, no lesions.  Normal urethral meatus, no lesions, no prolapse, no discharge.   No urethral masses, tenderness and/or tenderness. A large vaginal polyp is seen in the introitus and appears to be adhered to the right vaginal wall.  No bladder  fullness, tenderness or masses. fair vagina mucosa, fair estrogen effect, no discharge, no lesions, fair pelvic support, grade I cystocele and  rectocele noted.  Anus and perineum are without rashes or lesions.     Neurologic: Grossly intact, no focal deficits, moving all 4 extremities. Psychiatric: Normal mood and affect.    Laboratory Data: N/A   Pertinent Imaging: N/A   Assessment & Plan:    1. Gross hematuria -I explained that we should approach the blood on her toilet tissue the same as we would approach it if she actually was urinating blood - I explained to the patient that  there are a number of causes that can be associated with blood in the urine, such as stones, UTI's, damage to the urinary tract and/or cancer. -at this time, they are in a high risk stratification for hematuria per AUA guidelines due to their age (>60 years) and gross heme  -recommended studies for high risk are CT urogram and cysto - I explained to the patient that a contrast material will be injected into a vein and that in rare instances, an allergic reaction can result and may even life threatening (1:100,000)  The patient denies any allergies to contrast, iodine and/or seafood and is not taking metformin. - Following the imaging study,  I've recommended a cystoscopy. I described how this is performed, typically in an office setting with a flexible cystoscope. We described the risks, benefits, and possible side effects, the most common of which is a minor amount of blood in the urine and/or burning which usually resolves in 24 to 48 hours.   - The patient had the opportunity to ask questions which were answered. Based upon this discussion, the patient is willing to proceed. Therefore, I've ordered: a CT Urogram and cystoscopy. - The patient will return following all of the above for discussion of the results.  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 OAB -not at goal with Myrbetriq 50 mg daily -will return to the issue pending CTU and cysto results  Return for CT Urogram report and cystoscopy .  These notes generated with voice recognition software. I apologize for typographical errors.  Zara Council, PA-C  [facility] [address]  [place], [place] [address] [phone]

## 2021-11-23 ENCOUNTER — Encounter: Payer: Self-pay | Admitting: Urology

## 2021-11-23 ENCOUNTER — Other Ambulatory Visit: Payer: Self-pay

## 2021-11-23 ENCOUNTER — Ambulatory Visit (INDEPENDENT_AMBULATORY_CARE_PROVIDER_SITE_OTHER): Payer: Medicare Other | Admitting: Urology

## 2021-11-23 VITALS — BP 228/104 | HR 87 | Ht 62.0 in | Wt 199.0 lb

## 2021-11-23 DIAGNOSIS — R310 Gross hematuria: Secondary | ICD-10-CM | POA: Diagnosis not present

## 2021-11-23 DIAGNOSIS — N3281 Overactive bladder: Secondary | ICD-10-CM | POA: Diagnosis not present

## 2021-11-23 DIAGNOSIS — N842 Polyp of vagina: Secondary | ICD-10-CM

## 2021-11-24 ENCOUNTER — Encounter: Payer: Self-pay | Admitting: Obstetrics and Gynecology

## 2021-11-29 ENCOUNTER — Other Ambulatory Visit: Payer: Self-pay | Admitting: Urology

## 2021-11-29 DIAGNOSIS — R310 Gross hematuria: Secondary | ICD-10-CM

## 2021-12-12 ENCOUNTER — Ambulatory Visit
Admission: RE | Admit: 2021-12-12 | Discharge: 2021-12-12 | Disposition: A | Discharge status: Home / Self Care | Payer: Medicare Other | Source: Ambulatory Visit | Attending: Urology | Admitting: Urology

## 2021-12-12 DIAGNOSIS — R310 Gross hematuria: Secondary | ICD-10-CM | POA: Insufficient documentation

## 2021-12-12 LAB — POCT I-STAT CREATININE: Creatinine, Ser: 0.7 mg/dL (ref 0.44–1.00)

## 2021-12-12 MED ORDER — IOHEXOL 300 MG/ML  SOLN
100.0000 mL | Freq: Once | INTRAMUSCULAR | Status: AC | PRN
  Administered 2021-12-12: 100 mL via INTRAVENOUS

## 2021-12-14 ENCOUNTER — Encounter: Payer: Self-pay | Admitting: Urology

## 2021-12-14 ENCOUNTER — Ambulatory Visit (INDEPENDENT_AMBULATORY_CARE_PROVIDER_SITE_OTHER): Payer: Medicare Other | Admitting: Urology

## 2021-12-14 VITALS — BP 208/103 | HR 92 | Ht 62.0 in | Wt 199.0 lb

## 2021-12-14 DIAGNOSIS — R350 Frequency of micturition: Secondary | ICD-10-CM

## 2021-12-14 DIAGNOSIS — R310 Gross hematuria: Secondary | ICD-10-CM

## 2021-12-14 MED ORDER — MIRABEGRON ER 50 MG PO TB24: 50.0000 mg | ORAL_TABLET | Freq: Every day | ORAL | 11 refills | Status: DC

## 2021-12-14 MED ORDER — LIDOCAINE HCL URETHRAL/MUCOSAL 2 % EX GEL
1.0000 "application " | Freq: Once | CUTANEOUS | Status: AC
  Administered 2021-12-14: 1 via URETHRAL

## 2021-12-14 NOTE — Progress Notes (Signed)
Cystoscopy Procedure Note: ? ?Indication: Gross hematuria ? ?On pelvic exam there was a large fleshy polyp-like mass in the vagina that was nontender, this did not appear to be a cystocele.  Has appointment with gynecology Dr. Amalia Hailey in the next few weeks for further evaluation ? ?After informed consent and discussion of the procedure and its risks, Rachel Valenzuela was positioned and prepped in the standard fashion. Cystoscopy was performed with a flexible cystoscope. The urethra, bladder neck and entire bladder was visualized in a standard fashion.  No evidence of cystocele.  The ureteral orifices were visualized in their normal location and orientation.  Bladder mucosa grossly normal throughout, no abnormalities on retroflexion ? ?Imaging: ?CT urogram with 6 mm right lower pole nonobstructing stone, no other urologic abnormalities ? ?Findings: ?Normal cystoscopy ? ?Assessment and Plan: ?-Continue Myrbetriq 50 mg daily for OAB, behavioral strategies discussed, relationship between uncontrolled diabetes and bladder symptoms also reviewed again ?-Keep GYN follow-up for vaginal mass in the next few weeks ?-RTC Zara Council, PA 6 months symptom check.  Consider yearly KUB for surveillance of right lower pole stone ? ?Nickolas Madrid, MD ?12/14/2021  ? ?

## 2021-12-14 NOTE — Patient Instructions (Signed)

## 2021-12-15 LAB — MICROSCOPIC EXAMINATION

## 2021-12-15 LAB — URINALYSIS, COMPLETE
Bilirubin, UA: NEGATIVE
Ketones, UA: NEGATIVE
Leukocytes,UA: NEGATIVE
Nitrite, UA: NEGATIVE
Protein,UA: NEGATIVE
Specific Gravity, UA: 1.025 (ref 1.005–1.030)
Urobilinogen, Ur: 0.2 mg/dL (ref 0.2–1.0)
pH, UA: 5.5 (ref 5.0–7.5)

## 2021-12-28 ENCOUNTER — Ambulatory Visit
Admission: RE | Admit: 2021-12-28 | Discharge: 2021-12-28 | Disposition: A | Discharge status: Home / Self Care | Payer: Medicare Other | Source: Ambulatory Visit | Attending: Family Medicine | Admitting: Family Medicine

## 2021-12-28 DIAGNOSIS — Z1231 Encounter for screening mammogram for malignant neoplasm of breast: Secondary | ICD-10-CM | POA: Diagnosis present

## 2022-01-12 ENCOUNTER — Ambulatory Visit (INDEPENDENT_AMBULATORY_CARE_PROVIDER_SITE_OTHER): Payer: Medicare Other | Admitting: Obstetrics and Gynecology

## 2022-01-12 ENCOUNTER — Encounter: Payer: Self-pay | Admitting: Obstetrics and Gynecology

## 2022-01-12 VITALS — BP 213/119 | HR 77 | Ht 62.0 in | Wt 194.2 lb

## 2022-01-12 DIAGNOSIS — Z7689 Persons encountering health services in other specified circumstances: Secondary | ICD-10-CM

## 2022-01-12 DIAGNOSIS — N950 Postmenopausal bleeding: Secondary | ICD-10-CM | POA: Diagnosis not present

## 2022-01-12 DIAGNOSIS — N842 Polyp of vagina: Secondary | ICD-10-CM

## 2022-01-12 NOTE — Progress Notes (Signed)
HPI: ?     Rachel Valenzuela is a 61 y.o. [phone] who LMP was No LMP recorded. Patient has had a hysterectomy. ? ?Subjective:  ? ?She presents today stating that during a work-up for urologic issues, her urologist found a polyp in the vagina.  In addition she had a CT scan which revealed multiple uterine fibroids. ?Upon further questioning it sounds as if the patient had an endometrial ablation many years ago for heavy bleeding. ?In addition she has had some spotting off and on over the last year which is very irregular. ? ?  Hx: ?The following portions of the patient's history were reviewed and updated as appropriate: ?            She  has a past medical history of Asthma, Diabetes mellitus without complication ([place]), Hypertension, and Osteoarthritis. ?She does not have any pertinent problems on file. ?She  has a past surgical history that includes Abdominal hysterectomy. ?Her family history is not on file. ?She  reports that she has never smoked. She has never used smokeless tobacco. She reports that she does not drink alcohol and does not use drugs. ?She has a current medication list which includes the following prescription(s): amlodipine, atenolol, azelastine, breo ellipta, vitamin d, cyclobenzaprine, fluoxetine, hydralazine, ibuprofen, januvia, latanoprost, levocetirizine, levothyroxine, meclizine, meloxicam, mirabegron er, montelukast, proair hfa, simbrinza, simvastatin, trazodone, and trulicity. ?She is allergic to ramipril and sulfa antibiotics. ?      ?Review of Systems:  ?Review of Systems ? ?Constitutional: Denied constitutional symptoms, night sweats, recent illness, fatigue, fever, insomnia and weight loss.  ?Eyes: Denied eye symptoms, eye pain, photophobia, vision change and visual disturbance.  ?Ears/Nose/Throat/Neck: Denied ear, nose, throat or neck symptoms, hearing loss, nasal discharge, sinus congestion and sore throat.  ?Cardiovascular: Denied cardiovascular symptoms, arrhythmia, chest  pain/pressure, edema, exercise intolerance, orthopnea and palpitations.  ?Respiratory: Denied pulmonary symptoms, asthma, pleuritic pain, productive sputum, cough, dyspnea and wheezing.  ?Gastrointestinal: Denied, gastro-esophageal reflux, melena, nausea and vomiting.  ?Genitourinary: See HPI for additional information.  ?Musculoskeletal: Denied musculoskeletal symptoms, stiffness, swelling, muscle weakness and myalgia.  ?Dermatologic: Denied dermatology symptoms, rash and scar.  ?Neurologic: Denied neurology symptoms, dizziness, headache, neck pain and syncope.  ?Psychiatric: Denied psychiatric symptoms, anxiety and depression.  ?Endocrine: Denied endocrine symptoms including hot flashes and night sweats.  ? ?Meds: ?  ?Current Outpatient Medications on File Prior to Visit  ?Medication Sig Dispense Refill  ? amLODipine (NORVASC) 10 MG tablet Take 10 mg by mouth daily.    ? atenolol (TENORMIN) 100 MG tablet Take 100 mg by mouth daily.    ? azelastine (ASTELIN) 0.1 % nasal spray SMARTSIG:1-2 Puff(s) Both Nares Twice Daily    ? BREO ELLIPTA 200-25 MCG/INH AEPB Inhale 1 puff into the lungs daily.    ? Cholecalciferol (VITAMIN D) 50 MCG (2000 UT) CAPS Take by mouth daily.    ? cyclobenzaprine (FLEXERIL) 5 MG tablet Take 5 mg by mouth 3 (three) times daily as needed for muscle spasms.    ? FLUoxetine (PROZAC) 40 MG capsule Take 40 mg by mouth daily.    ? hydrALAZINE (APRESOLINE) 100 MG tablet Take 100 mg by mouth 2 (two) times daily.    ? ibuprofen (ADVIL) 800 MG tablet Take by mouth as needed.    ? JANUVIA 100 MG tablet Take 100 mg by mouth daily.    ? latanoprost (XALATAN) 0.005 % ophthalmic solution 1 drop 2 (two) times daily.    ? levocetirizine (XYZAL) 5 MG  tablet Take 5 mg by mouth daily.    ? levothyroxine (SYNTHROID) 150 MCG tablet Take 150 mcg by mouth daily.    ? meclizine (ANTIVERT) 25 MG tablet Take by mouth 3 (three) times daily as needed.    ? meloxicam (MOBIC) 15 MG tablet Take 15 mg by mouth daily.    ?  mirabegron ER (MYRBETRIQ) 50 MG TB24 tablet Take 1 tablet (50 mg total) by mouth daily. 30 tablet 11  ? montelukast (SINGULAIR) 10 MG tablet Take 10 mg by mouth daily.    ? PROAIR HFA 108 (90 Base) MCG/ACT inhaler Inhale into the lungs as needed.    ? SIMBRINZA 1-0.2 % SUSP Apply 1 drop to eye 2 (two) times daily.    ? simvastatin (ZOCOR) 40 MG tablet Take by mouth.    ? traZODone (DESYREL) 100 MG tablet Take 200 mg by mouth at bedtime as needed.    ? TRULICITY 1.5 RD/4.0CX SOPN Inject into the skin once a week.    ? ?No current facility-administered medications on file prior to visit.  ? ? ? ? ?Objective:  ?  ? ?Vitals:  ? 01/12/22 1346  ?BP: (!) 213/119  ?Pulse: 77  ? ?Filed Weights  ? 01/12/22 1346  ?Weight: 194 lb 3.2 oz (88.1 kg)  ? ?  ?         Physical examination ?  Pelvic:   ?Vulva: Normal appearance.  No lesions.  ?Vagina: No lesions or abnormalities noted.  ?Support: Third-degree uterine descensus  ?Urethra No masses tenderness or scarring.  ?Meatus Normal size without lesions or prolapse.  ?Cervix: Normal appearance without lesions  ?Anus: Normal exam.  No lesions.  ?Perineum: Normal exam.  No lesions.  ?      Bimanual   ?Uterus: Normal size.  Non-tender.  Mobile.  AV.  ?Adnexae: No masses.  Non-tender to palpation.  ?Cul-de-sac: Negative for abnormality.  ? ? ?        ? ?Assessment:  ?  ?[id number] ?Patient Active Problem List  ? Diagnosis Date Noted  ? Primary osteoarthritis of right knee 11/06/2017  ? Diabetes mellitus type 2, uncomplicated ([place]) 31/49/7026  ? Obesity (BMI 35.0-39.9 without comorbidity) 10/17/2017  ? ?  ?1. Establishing care with new doctor, encounter for   ?2. Postmenopausal bleeding   ? ? No evidence of vaginal or endocervical polyp. ? ? ?Plan:  ?  ?       ? 1.  Patient likely has uterine fibroids from when she was younger.  I assume they have decreased in size.  She previously had an endometrial ablation for what sounds like that for heavy bleeding premenopausally. ?2.  Has occasional  spotting which is likely postmenopausal bleeding of unknown origin.  I  have recommended an ultrasound to further delineate the fibroids and to measure the endometrium.  Should everything an endometrial biopsy will be necessary. ? ?We have discussed uterine fibroids in detail.  Postmenopausal bleeding was discussed.  All questions answered. ?Orders ?Orders Placed This Encounter  ?Procedures  ? US PELVIS (TRANSABDOMINAL ONLY)  ? US PELVIS TRANSVAGINAL NON-OB (TV ONLY)  ? ? No orders of the defined types were placed in this encounter. ?  ?  F/U ? Return for We will contact her with any abnormal test results. ?I spent 35 minutes involved in the care of this patient preparing to see the patient by obtaining and reviewing her medical history (including labs, imaging tests and prior procedures), documenting clinical information in the  electronic health record (EHR), counseling and coordinating care plans, writing and sending prescriptions, ordering tests or procedures and in direct communicating with the patient and medical staff discussing pertinent items from her history and physical exam. ? ?Finis Bud, M.D. ?01/12/2022 ?2:22 PM ? ? ? ?

## 2022-01-12 NOTE — Progress Notes (Signed)
Patient presents today to establish care and discuss recent imaging. She states postmenopausal bleeding on off and states she believes she had a partial hysterectomy. Patient states no other questions or concerns.  ?

## 2022-01-31 ENCOUNTER — Ambulatory Visit (INDEPENDENT_AMBULATORY_CARE_PROVIDER_SITE_OTHER): Payer: Medicare Other

## 2022-01-31 DIAGNOSIS — N950 Postmenopausal bleeding: Secondary | ICD-10-CM | POA: Diagnosis not present

## 2022-02-07 ENCOUNTER — Telehealth: Payer: Self-pay | Admitting: Obstetrics and Gynecology

## 2022-02-07 NOTE — Telephone Encounter (Signed)
Pt is asking for a call back in regards to her [place] . Pt does not have wifi or access to MY Chart. Please return her call.

## 2022-02-09 NOTE — Telephone Encounter (Signed)
Spoke with patient. Scheduled 6/13.

## 2022-02-28 ENCOUNTER — Ambulatory Visit (INDEPENDENT_AMBULATORY_CARE_PROVIDER_SITE_OTHER): Payer: Medicare Other | Admitting: Obstetrics and Gynecology

## 2022-02-28 ENCOUNTER — Other Ambulatory Visit (HOSPITAL_COMMUNITY)
Admission: RE | Admit: 2022-02-28 | Discharge: 2022-02-28 | Disposition: A | Discharge status: Home / Self Care | Payer: Medicare Other | Source: Ambulatory Visit | Attending: Obstetrics and Gynecology | Admitting: Obstetrics and Gynecology

## 2022-02-28 ENCOUNTER — Encounter: Payer: Self-pay | Admitting: Obstetrics and Gynecology

## 2022-02-28 VITALS — BP 127/76 | HR 77 | Ht 62.0 in | Wt 200.0 lb

## 2022-02-28 DIAGNOSIS — N950 Postmenopausal bleeding: Secondary | ICD-10-CM | POA: Insufficient documentation

## 2022-02-28 DIAGNOSIS — D219 Benign neoplasm of connective and other soft tissue, unspecified: Secondary | ICD-10-CM | POA: Diagnosis not present

## 2022-02-28 NOTE — Progress Notes (Signed)
Patient presents today for an endometrial biopsy. Since her last visit she states she had one day of light bleeding, resolved on its own. Patient states no questions or concerns at this time.

## 2022-02-28 NOTE — Progress Notes (Addendum)
HPI:      [name] is a 61 y.o. [phone] who LMP was No LMP recorded. Patient has had an ablation.  Subjective:   She presents today for follow-up of ultrasound.  She has had some episodes of postmenopausal bleeding.  She has previously had an endometrial ablation.  Her ultrasound shows some small fibroids and a slightly thickened (6 mm) endometrium.  She presents to have an endometrial biopsy performed if possible.    Hx: The following portions of the patient's history were reviewed and updated as appropriate:             She  has a past medical history of Asthma, Diabetes mellitus without complication ([place]), Hypertension, and Osteoarthritis. She does not have any pertinent problems on file. She  has a past surgical history that includes Ablation. Her family history is not on file. She  reports that she has never smoked. She has never used smokeless tobacco. She reports that she does not drink alcohol and does not use drugs. She has a current medication list which includes the following prescription(s): amlodipine, atenolol, azelastine, breo ellipta, vitamin d, cyclobenzaprine, fluoxetine, hydralazine, ibuprofen, januvia, latanoprost, levocetirizine, levothyroxine, meclizine, meloxicam, mirabegron er, montelukast, proair hfa, simbrinza, simvastatin, trazodone, and trulicity. She is allergic to ramipril and sulfa antibiotics.       Review of Systems:  Review of Systems  Constitutional: Denied constitutional symptoms, night sweats, recent illness, fatigue, fever, insomnia and weight loss.  Eyes: Denied eye symptoms, eye pain, photophobia, vision change and visual disturbance.  Ears/Nose/Throat/Neck: Denied ear, nose, throat or neck symptoms, hearing loss, nasal discharge, sinus congestion and sore throat.  Cardiovascular: Denied cardiovascular symptoms, arrhythmia, chest pain/pressure, edema, exercise intolerance, orthopnea and palpitations.  Respiratory: Denied pulmonary symptoms,  asthma, pleuritic pain, productive sputum, cough, dyspnea and wheezing.  Gastrointestinal: Denied, gastro-esophageal reflux, melena, nausea and vomiting.  Genitourinary: See HPI for additional information.  Musculoskeletal: Denied musculoskeletal symptoms, stiffness, swelling, muscle weakness and myalgia.  Dermatologic: Denied dermatology symptoms, rash and scar.  Neurologic: Denied neurology symptoms, dizziness, headache, neck pain and syncope.  Psychiatric: Denied psychiatric symptoms, anxiety and depression.  Endocrine: Denied endocrine symptoms including hot flashes and night sweats.   Meds:   Current Outpatient Medications on File Prior to Visit  Medication Sig Dispense Refill   amLODipine (NORVASC) 10 MG tablet Take 10 mg by mouth daily.     atenolol (TENORMIN) 100 MG tablet Take 100 mg by mouth daily.     azelastine (ASTELIN) 0.1 % nasal spray SMARTSIG:1-2 Puff(s) Both Nares Twice Daily     BREO ELLIPTA 200-25 MCG/INH AEPB Inhale 1 puff into the lungs daily.     Cholecalciferol (VITAMIN D) 50 MCG (2000 UT) CAPS Take by mouth daily.     cyclobenzaprine (FLEXERIL) 5 MG tablet Take 5 mg by mouth 3 (three) times daily as needed for muscle spasms.     FLUoxetine (PROZAC) 40 MG capsule Take 40 mg by mouth daily.     hydrALAZINE (APRESOLINE) 100 MG tablet Take 100 mg by mouth 2 (two) times daily.     ibuprofen (ADVIL) 800 MG tablet Take by mouth as needed.     JANUVIA 100 MG tablet Take 100 mg by mouth daily.     latanoprost (XALATAN) 0.005 % ophthalmic solution 1 drop 2 (two) times daily.     levocetirizine (XYZAL) 5 MG tablet Take 5 mg by mouth daily.     levothyroxine (SYNTHROID) 150 MCG tablet Take 150 mcg by mouth  daily.     meclizine (ANTIVERT) 25 MG tablet Take by mouth 3 (three) times daily as needed.     meloxicam (MOBIC) 15 MG tablet Take 15 mg by mouth daily.     mirabegron ER (MYRBETRIQ) 50 MG TB24 tablet Take 1 tablet (50 mg total) by mouth daily. 30 tablet 11   montelukast  (SINGULAIR) 10 MG tablet Take 10 mg by mouth daily.     PROAIR HFA 108 (90 Base) MCG/ACT inhaler Inhale into the lungs as needed.     SIMBRINZA 1-0.2 % SUSP Apply 1 drop to eye 2 (two) times daily.     simvastatin (ZOCOR) 40 MG tablet Take by mouth.     traZODone (DESYREL) 100 MG tablet Take 200 mg by mouth at bedtime as needed.     TRULICITY 1.5 TG/6.2IR SOPN Inject into the skin once a week.     No current facility-administered medications on file prior to visit.      Objective:     Vitals:   02/28/22 1029  BP: 127/76  Pulse: 77   Filed Weights   02/28/22 1029  Weight: 200 lb (90.7 kg)              Physical examination   Pelvic:   Vulva: Normal appearance.  No lesions.  Vagina: No lesions or abnormalities noted.  Support: Normal pelvic support.  Urethra No masses tenderness or scarring.  Meatus Normal size without lesions or prolapse.  Cervix: Normal appearance.  No lesions.  Anus: Normal exam.  No lesions.  Perineum: Normal exam.  No lesions.        Bimanual   Uterus: Normal size.  Non-tender.  Mobile.  AV.  Adnexae: No masses.  Non-tender to palpation.  Cul-de-sac: Negative for abnormality.   Endometrial Biopsy After discussion with the patient regarding her abnormal uterine bleeding I recommended that she proceed with an endometrial biopsy for further diagnosis. The risks, benefits, alternatives, and indications for an endometrial biopsy were discussed with the patient in detail. She understood the risks including infection, bleeding, cervical laceration and uterine perforation.  Verbal consent was obtained.   PROCEDURE NOTE:  Vacurette endometrial biopsy was performed using aseptic technique with iodine preparation.  I was unable to pass the pipette past 6 cm.  Dilation of the inner cervical os was attempted but not successful.  I cannot tell if it is a tight in her cervical os or scarring from the endometrial ablation. The uterus was sounded to a length of 6 cm.   Adequate sampling was not obtained with minimal blood loss.  The patient tolerated the procedure well.  Disposition will be pending pathology           Assessment:    [id number] Patient Active Problem List   Diagnosis Date Noted   Primary osteoarthritis of right knee 11/06/2017   Diabetes mellitus type 2, uncomplicated ([place]) 03/50/0938   Obesity (BMI 35.0-39.9 without comorbidity) 10/17/2017     1. Postmenopausal bleeding   2. Fibroids        Plan:            1.  We will discuss with Dr. Fransisca Connors regarding possible neck steps.  Also will await results from endometrial biopsy although sample very small. Orders No orders of the defined types were placed in this encounter.   No orders of the defined types were placed in this encounter.     F/U  Return for We will contact her with any abnormal test  results. I spent 22 minutes involved in the care of this patient preparing to see the patient by obtaining and reviewing her medical history (including labs, imaging tests and prior procedures), documenting clinical information in the electronic health record (EHR), counseling and coordinating care plans, writing and sending prescriptions, ordering tests or procedures and in direct communicating with the patient and medical staff discussing pertinent items from her history and physical exam.  Finis Bud, M.D. 02/28/2022 11:35 AM  Addend: I spoke with Dr. Fransisca Connors regarding this case and per his own admission he could offer no further insights.  He recommended a discussion with her regarding her low risk of malignancy but not 0 versus hysterectomy versus low possibility of successful hysteroscopy D&C.  He was not opposed to a trial of vaginal estrogen to see if the bleeding resolved, however of course this would not be diagnostic. Plan to contact patient after biopsy results return

## 2022-03-02 LAB — SURGICAL PATHOLOGY

## 2022-03-03 NOTE — Progress Notes (Signed)
Based on her endometrial biopsy results and the difficulty of the biopsy I recommend she schedule a 15-minute video visit or a 15-minute office visit with me.

## 2022-03-07 ENCOUNTER — Ambulatory Visit (INDEPENDENT_AMBULATORY_CARE_PROVIDER_SITE_OTHER): Payer: Medicare Other | Admitting: Obstetrics and Gynecology

## 2022-03-07 ENCOUNTER — Encounter: Payer: Self-pay | Admitting: Obstetrics and Gynecology

## 2022-03-07 VITALS — BP 178/108 | HR 82 | Ht 62.0 in | Wt 200.5 lb

## 2022-03-07 DIAGNOSIS — N950 Postmenopausal bleeding: Secondary | ICD-10-CM

## 2022-03-07 NOTE — Progress Notes (Signed)
HPI:      [name] is a 61 y.o. [phone] who LMP was No LMP recorded. Patient has had an ablation.  Subjective:   She presents today because over the last several months she has occasionally had a very small amount of vaginal bleeding/spotting.  This is not regular and it is rare.  She had an ultrasound which showed a very slightly thickened endometrium for someone in menopause.  Unfortunately she has previously had an endometrial ablation.  Attempt at endometrial sampling failed.  She presents today to discuss her current situation.    Hx: The following portions of the patient's history were reviewed and updated as appropriate:             She  has a past medical history of Asthma, Diabetes mellitus without complication ([place]), Hypertension, and Osteoarthritis. She does not have any pertinent problems on file. She  has a past surgical history that includes Ablation. Her family history is not on file. She  reports that she has never smoked. She has never used smokeless tobacco. She reports that she does not drink alcohol and does not use drugs. She has a current medication list which includes the following prescription(s): amlodipine, atenolol, azelastine, breo ellipta, vitamin d, cyclobenzaprine, fluoxetine, hydralazine, ibuprofen, januvia, latanoprost, levocetirizine, levothyroxine, meclizine, meloxicam, mirabegron er, montelukast, proair hfa, simbrinza, simvastatin, trazodone, and trulicity. She is allergic to ramipril and sulfa antibiotics.       Review of Systems:  Review of Systems  Constitutional: Denied constitutional symptoms, night sweats, recent illness, fatigue, fever, insomnia and weight loss.  Eyes: Denied eye symptoms, eye pain, photophobia, vision change and visual disturbance.  Ears/Nose/Throat/Neck: Denied ear, nose, throat or neck symptoms, hearing loss, nasal discharge, sinus congestion and sore throat.  Cardiovascular: Denied cardiovascular symptoms, arrhythmia,  chest pain/pressure, edema, exercise intolerance, orthopnea and palpitations.  Respiratory: Denied pulmonary symptoms, asthma, pleuritic pain, productive sputum, cough, dyspnea and wheezing.  Gastrointestinal: Denied, gastro-esophageal reflux, melena, nausea and vomiting.  Genitourinary: Denied genitourinary symptoms including symptomatic vaginal discharge, pelvic relaxation issues, and urinary complaints.  Musculoskeletal: Denied musculoskeletal symptoms, stiffness, swelling, muscle weakness and myalgia.  Dermatologic: Denied dermatology symptoms, rash and scar.  Neurologic: Denied neurology symptoms, dizziness, headache, neck pain and syncope.  Psychiatric: Denied psychiatric symptoms, anxiety and depression.  Endocrine: Denied endocrine symptoms including hot flashes and night sweats.   Meds:   Current Outpatient Medications on File Prior to Visit  Medication Sig Dispense Refill   amLODipine (NORVASC) 10 MG tablet Take 10 mg by mouth daily.     atenolol (TENORMIN) 100 MG tablet Take 100 mg by mouth daily.     azelastine (ASTELIN) 0.1 % nasal spray SMARTSIG:1-2 Puff(s) Both Nares Twice Daily     BREO ELLIPTA 200-25 MCG/INH AEPB Inhale 1 puff into the lungs daily.     Cholecalciferol (VITAMIN D) 50 MCG (2000 UT) CAPS Take by mouth daily.     cyclobenzaprine (FLEXERIL) 5 MG tablet Take 5 mg by mouth 3 (three) times daily as needed for muscle spasms.     FLUoxetine (PROZAC) 40 MG capsule Take 40 mg by mouth daily.     hydrALAZINE (APRESOLINE) 100 MG tablet Take 100 mg by mouth 2 (two) times daily.     ibuprofen (ADVIL) 800 MG tablet Take by mouth as needed.     JANUVIA 100 MG tablet Take 100 mg by mouth daily.     latanoprost (XALATAN) 0.005 % ophthalmic solution 1 drop 2 (two) times daily.  levocetirizine (XYZAL) 5 MG tablet Take 5 mg by mouth daily.     levothyroxine (SYNTHROID) 150 MCG tablet Take 150 mcg by mouth daily.     meclizine (ANTIVERT) 25 MG tablet Take by mouth 3 (three)  times daily as needed.     meloxicam (MOBIC) 15 MG tablet Take 15 mg by mouth daily.     mirabegron ER (MYRBETRIQ) 50 MG TB24 tablet Take 1 tablet (50 mg total) by mouth daily. 30 tablet 11   montelukast (SINGULAIR) 10 MG tablet Take 10 mg by mouth daily.     PROAIR HFA 108 (90 Base) MCG/ACT inhaler Inhale into the lungs as needed.     SIMBRINZA 1-0.2 % SUSP Apply 1 drop to eye 2 (two) times daily.     simvastatin (ZOCOR) 40 MG tablet Take by mouth.     traZODone (DESYREL) 100 MG tablet Take 200 mg by mouth at bedtime as needed.     TRULICITY 1.5 WC/3.7SE SOPN Inject into the skin once a week.     No current facility-administered medications on file prior to visit.      Objective:     Vitals:   03/07/22 1402 03/07/22 1433  BP: (!) 200/98 (!) 178/108  Pulse: 88 82   Filed Weights   03/07/22 1402  Weight: 200 lb 8 oz (90.9 kg)                        Assessment:    [id number] Patient Active Problem List   Diagnosis Date Noted   Primary osteoarthritis of right knee 11/06/2017   Diabetes mellitus type 2, uncomplicated ([place]) 16/03/3709   Obesity (BMI 35.0-39.9 without comorbidity) 10/17/2017     1. Postmenopausal bleeding     Other than her 2 days of bleeding after the attempted endometrial biopsy she has not bled since her last visit. I have spoken with Dr. Fransisca Connors regarding possibilities for her care and we essentially boiled them down to either she lives with a small risk of endometrial cancer (probably less than 5% based on her endometrial thickness) or she undergoes hysterectomy.  This because endometrial biopsy and hysteroscopy D&C not likely to be successful because of her endometrial ablation.   Plan:            1.  I spoken with her regarding her work-up to date and the above possibilities.  She would not like a hysterectomy at this time.  She wants some time to consider her options.  I have asked her to make an appointment and check in with me in fewer than 6  months.  If she changes her mind and one surgery before then I have asked her to make an appointment.  At her 12-monthcheckup we will again discuss her options and make sure she remains comfortable with her decision. Orders No orders of the defined types were placed in this encounter.   No orders of the defined types were placed in this encounter.     F/U   No follow-ups on file. I spent 23 minutes involved in the care of this patient preparing to see the patient by obtaining and reviewing her medical history (including labs, imaging tests and prior procedures), documenting clinical information in the electronic health record (EHR), counseling and coordinating care plans, writing and sending prescriptions, ordering tests or procedures and in direct communicating with the patient and medical staff discussing pertinent items from her history and physical exam.  DGrayling Congress  Amalia Hailey, M.D. 03/07/2022 2:49 PM

## 2022-03-07 NOTE — Progress Notes (Signed)
Patient presents today to discuss lab results. She states she is nervous today. Patient reports slight bleeding and cramping for 1-2 days post biopsy but nothing since. No other concerns at this time.

## 2022-06-15 NOTE — Progress Notes (Signed)
06/16/2022 11:57 AM   Rachel Valenzuela 08/09/1961 [id number]  Referring provider: Marguerita Merles, [place],  [place] [address]  Urological history: 1. STI -trichomonas 03/2021  2. OAB -Contributing factors of age, vaginal atrophy, diabetes and artificial sweeteners -PVR 0 mL  3. High risk hematuria -non-smoker -CTU (11/2021) - bilateral nephrolithiasis measuring up to 7 mm on the right -cysto (11/2021) - NED -no reports of gross heme -UA > 30 RBC's and many bacteria  4.  Nephrolithiasis -Bilateral nephrolithiasis seen on CT urogram (11/2021)   HPI: Rachel Valenzuela is a 61 y.o. female who presents today for follow up after a work up for gross heme.  On the OAB questionnaire, she is indicated that she has 8 or more daytime urinations.  She has variable nocturia.  She has a mild urge to urinate.  She leaks urine when she laughs coughs sneezes and with urge.  She leaks urine 1-2 times weekly she wears panty liners 1-2 times daily and absorbent pad daily she does engage in toilet mapping.  UA with greater than 30 RBCs and many bacteria.    She will take Myrbetriq 50 mg as needed.  The microscopic hematuria may still be from her uterine bleeding as she seems blood on her toilet paper.  She has a follow-up with gynecology in December.   PMH: Past Medical History:  Diagnosis Date   Asthma    Diabetes mellitus without complication ([place])    Hypertension    Osteoarthritis     Surgical History: Past Surgical History:  Procedure Laterality Date   ABLATION      Home Medications:  Allergies as of 06/16/2022       Reactions   Ramipril Anaphylaxis, Hives   Sulfa Antibiotics    Other reaction(s): Other (See Comments)        Medication List        Accurate as of June 16, 2022 11:57 AM. If you have any questions, ask your nurse or doctor.          amLODipine 10 MG tablet Commonly known as: NORVASC Take 10 mg by mouth daily.   atenolol 100  MG tablet Commonly known as: TENORMIN Take 100 mg by mouth daily.   azelastine 0.1 % nasal spray Commonly known as: ASTELIN SMARTSIG:1-2 Puff(s) Both Nares Twice Daily   Breo Ellipta 200-25 MCG/ACT Aepb Generic drug: fluticasone furoate-vilanterol Inhale 1 puff into the lungs daily.   cyclobenzaprine 5 MG tablet Commonly known as: FLEXERIL Take 5 mg by mouth 3 (three) times daily as needed for muscle spasms.   FLUoxetine 40 MG capsule Commonly known as: PROZAC Take 40 mg by mouth daily.   hydrALAZINE 100 MG tablet Commonly known as: APRESOLINE Take 100 mg by mouth 2 (two) times daily.   ibuprofen 800 MG tablet Commonly known as: ADVIL Take by mouth as needed.   Januvia 100 MG tablet Generic drug: sitaGLIPtin Take 100 mg by mouth daily.   latanoprost 0.005 % ophthalmic solution Commonly known as: XALATAN 1 drop 2 (two) times daily.   levocetirizine 5 MG tablet Commonly known as: XYZAL Take 5 mg by mouth daily.   levothyroxine 150 MCG tablet Commonly known as: SYNTHROID Take 150 mcg by mouth daily.   meclizine 25 MG tablet Commonly known as: ANTIVERT Take by mouth 3 (three) times daily as needed.   meloxicam 15 MG tablet Commonly known as: MOBIC Take 15 mg by mouth daily.   mirabegron ER  50 MG Tb24 tablet Commonly known as: MYRBETRIQ Take 1 tablet (50 mg total) by mouth daily.   montelukast 10 MG tablet Commonly known as: SINGULAIR Take 10 mg by mouth daily.   ProAir HFA 108 (90 Base) MCG/ACT inhaler Generic drug: albuterol Inhale into the lungs as needed.   Simbrinza 1-0.2 % Susp Generic drug: Brinzolamide-Brimonidine Apply 1 drop to eye 2 (two) times daily.   simvastatin 40 MG tablet Commonly known as: ZOCOR Take by mouth.   traZODone 100 MG tablet Commonly known as: DESYREL Take 200 mg by mouth at bedtime as needed.   Trulicity 1.5 NL/8.9QJ Sopn Generic drug: Dulaglutide Inject into the skin once a week.   Vitamin D 50 MCG (2000 UT)  Caps Take by mouth daily.        Allergies:  Allergies  Allergen Reactions   Ramipril Anaphylaxis and Hives   Sulfa Antibiotics     Other reaction(s): Other (See Comments)    Family History: Family History  Problem Relation Age of Onset   Breast cancer Neg Hx     Social History:  reports that she has never smoked. She has never used smokeless tobacco. She reports that she does not drink alcohol and does not use drugs.  ROS: Pertinent ROS in HPI  Physical Exam: BP (!) 194/100   Pulse 86   Ht '5\' 1"'$  (1.549 m)   Wt 201 lb (91.2 kg)   BMI 37.98 kg/m   Constitutional:  Well nourished. Alert and oriented, No acute distress. HEENT: [place] AT, moist mucus membranes.  Trachea midline Cardiovascular: No clubbing, cyanosis, or edema. Respiratory: Normal respiratory effort, no increased work of breathing. Neurologic: Grossly intact, no focal deficits, moving all 4 extremities. Psychiatric: Normal mood and affect.    Laboratory Data: See Epic and HPI I have reviewed the labs.    Pertinent Imaging:  06/16/22 11:15  Scan Result 87m    Assessment & Plan:    1. High risk hematuria -non-smoker -work up 02/2022 - nephrolithiasis -no reports of gross heme -UA > 30 RBC's - likely uterine in origin as she is asymptomatic at this visit, recent negative hematuria work up and she is seeing blood on her toilet paper -continue to monitor   2. OAB -She will start taking the Myrbetriq 50 mg daily -We will have her return in 1 month to see if this provided any relief in her symptoms  3. Nephrolithiasis -bilateral nephrolithiasis -asymptomatic at today's visit   Return in about 1 month (around 07/16/2022) for OAB questionnaire, PVR and UA .  These notes generated with voice recognition software. I apologize for typographical errors.  SZara Council PA-C  [facility] [address] [place] [phone]

## 2022-06-16 ENCOUNTER — Ambulatory Visit (INDEPENDENT_AMBULATORY_CARE_PROVIDER_SITE_OTHER): Payer: Medicare Other | Admitting: Urology

## 2022-06-16 ENCOUNTER — Encounter: Payer: Self-pay | Admitting: Urology

## 2022-06-16 VITALS — BP 194/100 | HR 86 | Ht 61.0 in | Wt 201.0 lb

## 2022-06-16 DIAGNOSIS — N3281 Overactive bladder: Secondary | ICD-10-CM

## 2022-06-16 DIAGNOSIS — R319 Hematuria, unspecified: Secondary | ICD-10-CM | POA: Diagnosis not present

## 2022-06-16 LAB — URINALYSIS, COMPLETE
Bilirubin, UA: NEGATIVE
Glucose, UA: NEGATIVE
Ketones, UA: NEGATIVE
Leukocytes,UA: NEGATIVE
Nitrite, UA: NEGATIVE
Specific Gravity, UA: 1.02 (ref 1.005–1.030)
Urobilinogen, Ur: 1 mg/dL (ref 0.2–1.0)
pH, UA: 5.5 (ref 5.0–7.5)

## 2022-06-16 LAB — MICROSCOPIC EXAMINATION: RBC, Urine: >30 /hpf — AB (ref 0–2)

## 2022-06-16 LAB — BLADDER SCAN AMB NON-IMAGING

## 2022-07-17 NOTE — Progress Notes (Unsigned)
07/18/2022 1:01 PM   Rachel Valenzuela 1961/05/08 [id number]  Referring provider: Marguerita Merles, [place],  [place] [address]  Urological history: 1. STI -trichomonas 03/2021  2. OAB -Contributing factors of age, vaginal atrophy, diabetes and artificial sweeteners -PVR 0 mL  3. High risk hematuria -non-smoker -CTU (11/2021) - bilateral nephrolithiasis measuring up to 7 mm on the right -cysto (11/2021) - NED -no reports of gross heme -UA > 30 RBC's and many bacteria  4.  Nephrolithiasis -Bilateral nephrolithiasis seen on CT urogram (11/2021)   HPI: Rachel Valenzuela is a 61 y.o. female who presents today for 3 week follow up after trial of Myrbetriq.  On the OAB questionnaire, she is indicated that she has 8 or more daytime urinations.  She has variable nocturia.  She has a mild urge to urinate.  She leaks urine when she laughs coughs sneezes and with urge.  She leaks urine 1-2 times weekly she wears panty liners 1-2 times daily and absorbent pad daily she does engage in toilet mapping.  UA ***  She will take Myrbetriq 50 mg as needed.  The microscopic hematuria may still be from her uterine bleeding as she seems blood on her toilet paper.  She has a follow-up with gynecology in December.   PMH: Past Medical History:  Diagnosis Date   Asthma    Diabetes mellitus without complication ([place])    Hypertension    Osteoarthritis     Surgical History: Past Surgical History:  Procedure Laterality Date   ABLATION      Home Medications:  Allergies as of 07/18/2022       Reactions   Ramipril Anaphylaxis, Hives   Sulfa Antibiotics    Other reaction(s): Other (See Comments)        Medication List        Accurate as of July 17, 2022  1:01 PM. If you have any questions, ask your nurse or doctor.          amLODipine 10 MG tablet Commonly known as: NORVASC Take 10 mg by mouth daily.   atenolol 100 MG tablet Commonly known as:  TENORMIN Take 100 mg by mouth daily.   azelastine 0.1 % nasal spray Commonly known as: ASTELIN SMARTSIG:1-2 Puff(s) Both Nares Twice Daily   Breo Ellipta 200-25 MCG/ACT Aepb Generic drug: fluticasone furoate-vilanterol Inhale 1 puff into the lungs daily.   cyclobenzaprine 5 MG tablet Commonly known as: FLEXERIL Take 5 mg by mouth 3 (three) times daily as needed for muscle spasms.   FLUoxetine 40 MG capsule Commonly known as: PROZAC Take 40 mg by mouth daily.   hydrALAZINE 100 MG tablet Commonly known as: APRESOLINE Take 100 mg by mouth 2 (two) times daily.   ibuprofen 800 MG tablet Commonly known as: ADVIL Take by mouth as needed.   Januvia 100 MG tablet Generic drug: sitaGLIPtin Take 100 mg by mouth daily.   latanoprost 0.005 % ophthalmic solution Commonly known as: XALATAN 1 drop 2 (two) times daily.   levocetirizine 5 MG tablet Commonly known as: XYZAL Take 5 mg by mouth daily.   levothyroxine 150 MCG tablet Commonly known as: SYNTHROID Take 150 mcg by mouth daily.   meclizine 25 MG tablet Commonly known as: ANTIVERT Take by mouth 3 (three) times daily as needed.   meloxicam 15 MG tablet Commonly known as: MOBIC Take 15 mg by mouth daily.   mirabegron ER 50 MG Tb24 tablet Commonly known as: MYRBETRIQ Take  1 tablet (50 mg total) by mouth daily.   montelukast 10 MG tablet Commonly known as: SINGULAIR Take 10 mg by mouth daily.   ProAir HFA 108 (90 Base) MCG/ACT inhaler Generic drug: albuterol Inhale into the lungs as needed.   Simbrinza 1-0.2 % Susp Generic drug: Brinzolamide-Brimonidine Apply 1 drop to eye 2 (two) times daily.   simvastatin 40 MG tablet Commonly known as: ZOCOR Take by mouth.   traZODone 100 MG tablet Commonly known as: DESYREL Take 200 mg by mouth at bedtime as needed.   Trulicity 1.5 ZY/2.4MG Sopn Generic drug: Dulaglutide Inject into the skin once a week.   Vitamin D 50 MCG (2000 UT) Caps Take by mouth daily.         Allergies:  Allergies  Allergen Reactions   Ramipril Anaphylaxis and Hives   Sulfa Antibiotics     Other reaction(s): Other (See Comments)    Family History: Family History  Problem Relation Age of Onset   Breast cancer Neg Hx     Social History:  reports that she has never smoked. She has never used smokeless tobacco. She reports that she does not drink alcohol and does not use drugs.  ROS: Pertinent ROS in HPI  Physical Exam: There were no vitals taken for this visit.  Constitutional:  Well nourished. Alert and oriented, No acute distress. HEENT: [place] AT, moist mucus membranes.  Trachea midline, no masses. Cardiovascular: No clubbing, cyanosis, or edema. Respiratory: Normal respiratory effort, no increased work of breathing. GU: No CVA tenderness.  No bladder fullness or masses. Vulvovaginal atrophy w/ pallor, loss of rugae, introital retraction, excoriations.  Vulvar thinning, fusion of labia, clitoral hood retraction, prominent urethral meatus.   *** external genitalia, *** pubic hair distribution, no lesions.  Normal urethral meatus, no lesions, no prolapse, no discharge.   No urethral masses, tenderness and/or tenderness. No bladder fullness, tenderness or masses. *** vagina mucosa, *** estrogen effect, no discharge, no lesions, *** pelvic support, *** cystocele and *** rectocele noted.  No cervical motion tenderness.  Uterus is freely mobile and non-fixed.  No adnexal/parametria masses or tenderness noted.  Anus and perineum are without rashes or lesions.   ***  Neurologic: Grossly intact, no focal deficits, moving all 4 extremities. Psychiatric: Normal mood and affect.     Laboratory Data: See Epic and HPI I have reviewed the labs.    Pertinent Imaging:  *** Assessment & Plan:    1. High risk hematuria -non-smoker -work up 02/2022 - nephrolithiasis -no reports of gross heme -UA ***  2. OAB -She will start taking the Myrbetriq 50 mg daily -We will have her  return in 1 month to see if this provided any relief in her symptoms  3. Nephrolithiasis -bilateral nephrolithiasis -asymptomatic at today's visit   No follow-ups on file.  These notes generated with voice recognition software. I apologize for typographical errors.  [place], [place] [address]  [place],  [address] [phone]

## 2022-07-18 ENCOUNTER — Encounter: Payer: Self-pay | Admitting: Urology

## 2022-07-18 ENCOUNTER — Ambulatory Visit (INDEPENDENT_AMBULATORY_CARE_PROVIDER_SITE_OTHER): Payer: Medicare Other | Admitting: Urology

## 2022-07-18 VITALS — BP 155/77 | HR 86 | Ht 61.0 in | Wt 199.0 lb

## 2022-07-18 DIAGNOSIS — R319 Hematuria, unspecified: Secondary | ICD-10-CM | POA: Diagnosis not present

## 2022-07-18 DIAGNOSIS — Z87442 Personal history of urinary calculi: Secondary | ICD-10-CM | POA: Diagnosis not present

## 2022-07-18 DIAGNOSIS — N3281 Overactive bladder: Secondary | ICD-10-CM | POA: Diagnosis not present

## 2022-07-18 DIAGNOSIS — N200 Calculus of kidney: Secondary | ICD-10-CM

## 2022-07-18 LAB — BLADDER SCAN AMB NON-IMAGING

## 2022-07-18 LAB — URINALYSIS, COMPLETE
Bilirubin, UA: NEGATIVE
Glucose, UA: NEGATIVE
Ketones, UA: NEGATIVE
Nitrite, UA: NEGATIVE
Protein,UA: NEGATIVE
Specific Gravity, UA: 1.024 (ref 1.005–1.030)
Urobilinogen, Ur: 0.2 mg/dL (ref 0.2–1.0)
pH, UA: 5.5 (ref 5.0–7.5)

## 2022-07-18 LAB — MICROSCOPIC EXAMINATION: Epithelial Cells (non renal): >10 /hpf — AB (ref 0–10)

## 2022-07-18 MED ORDER — MIRABEGRON ER 50 MG PO TB24: 50.0000 mg | ORAL_TABLET | Freq: Every day | ORAL | 11 refills | Status: DC

## 2022-07-21 LAB — CULTURE, URINE COMPREHENSIVE

## 2022-07-24 ENCOUNTER — Telehealth: Payer: Self-pay | Admitting: *Deleted

## 2022-07-24 NOTE — Telephone Encounter (Signed)
Notified patient as instructed, patient pleased °

## 2022-07-24 NOTE — Telephone Encounter (Signed)
-----   Message from Nori Riis, PA-C sent at 07/24/2022  9:21 AM EST ----- Please let Mrs. Wagenaar know that her urine culture was negative for infection.

## 2022-09-06 ENCOUNTER — Encounter: Payer: Self-pay | Admitting: Obstetrics and Gynecology

## 2022-09-06 ENCOUNTER — Ambulatory Visit (INDEPENDENT_AMBULATORY_CARE_PROVIDER_SITE_OTHER): Payer: Medicare Other | Admitting: Obstetrics and Gynecology

## 2022-09-06 VITALS — BP 138/80 | HR 80 | Ht 61.0 in | Wt 199.0 lb

## 2022-09-06 DIAGNOSIS — N950 Postmenopausal bleeding: Secondary | ICD-10-CM

## 2022-09-06 NOTE — Progress Notes (Signed)
HPI:      [name] is a 61 y.o. [id number] who LMP was No LMP recorded. Patient has had an ablation.  Subjective:   She presents today for follow-up of postmenopausal bleeding.  She has previously had an endometrial ablation and 6 months ago she decided not to have a hysterectomy for further workup.  Her lining was slightly thickened by ultrasound. She reports that since that time her bleeding has significantly declined.  She rarely notices any further vaginal bleeding. She continues a workup with urology for hematuria and urge incontinence.  She is taking medication and is helping the urge incontinence.    Hx: The following portions of the patient's history were reviewed and updated as appropriate:             She  has a past medical history of Asthma, Diabetes mellitus without complication ([place]), Hypertension, and Osteoarthritis. She does not have any pertinent problems on file. She  has a past surgical history that includes Ablation. Her family history is not on file. She  reports that she has never smoked. She has never used smokeless tobacco. She reports that she does not drink alcohol and does not use drugs. She has a current medication list which includes the following prescription(s): amlodipine, atenolol, azelastine, breo ellipta, vitamin d, cyclobenzaprine, fluoxetine, hydralazine, ibuprofen, januvia, latanoprost, levocetirizine, levothyroxine, meclizine, meloxicam, mirabegron er, montelukast, proair hfa, simbrinza, simvastatin, trazodone, and trulicity. She is allergic to ramipril and sulfa antibiotics.       Review of Systems:  Review of Systems  Constitutional: Denied constitutional symptoms, night sweats, recent illness, fatigue, fever, insomnia and weight loss.  Eyes: Denied eye symptoms, eye pain, photophobia, vision change and visual disturbance.  Ears/Nose/Throat/Neck: Denied ear, nose, throat or neck symptoms, hearing loss, nasal discharge, sinus congestion and sore  throat.  Cardiovascular: Denied cardiovascular symptoms, arrhythmia, chest pain/pressure, edema, exercise intolerance, orthopnea and palpitations.  Respiratory: Denied pulmonary symptoms, asthma, pleuritic pain, productive sputum, cough, dyspnea and wheezing.  Gastrointestinal: Denied, gastro-esophageal reflux, melena, nausea and vomiting.  Genitourinary: See HPI for additional information.  Musculoskeletal: Denied musculoskeletal symptoms, stiffness, swelling, muscle weakness and myalgia.  Dermatologic: Denied dermatology symptoms, rash and scar.  Neurologic: Denied neurology symptoms, dizziness, headache, neck pain and syncope.  Psychiatric: Denied psychiatric symptoms, anxiety and depression.  Endocrine: Denied endocrine symptoms including hot flashes and night sweats.   Meds:   Current Outpatient Medications on File Prior to Visit  Medication Sig Dispense Refill   amLODipine (NORVASC) 10 MG tablet Take 10 mg by mouth daily.     atenolol (TENORMIN) 100 MG tablet Take 100 mg by mouth daily.     azelastine (ASTELIN) 0.1 % nasal spray SMARTSIG:1-2 Puff(s) Both Nares Twice Daily     BREO ELLIPTA 200-25 MCG/INH AEPB Inhale 1 puff into the lungs daily.     Cholecalciferol (VITAMIN D) 50 MCG (2000 UT) CAPS Take by mouth daily.     cyclobenzaprine (FLEXERIL) 5 MG tablet Take 5 mg by mouth 3 (three) times daily as needed for muscle spasms.     FLUoxetine (PROZAC) 40 MG capsule Take 40 mg by mouth daily.     hydrALAZINE (APRESOLINE) 100 MG tablet Take 100 mg by mouth 2 (two) times daily.     ibuprofen (ADVIL) 800 MG tablet Take by mouth as needed.     JANUVIA 100 MG tablet Take 100 mg by mouth daily.     latanoprost (XALATAN) 0.005 % ophthalmic solution 1 drop 2 (two) times  daily.     levocetirizine (XYZAL) 5 MG tablet Take 5 mg by mouth daily.     levothyroxine (SYNTHROID) 150 MCG tablet Take 150 mcg by mouth daily.     meclizine (ANTIVERT) 25 MG tablet Take by mouth 3 (three) times daily as  needed.     meloxicam (MOBIC) 15 MG tablet Take 15 mg by mouth daily.     mirabegron ER (MYRBETRIQ) 50 MG TB24 tablet Take 1 tablet (50 mg total) by mouth daily. 30 tablet 11   montelukast (SINGULAIR) 10 MG tablet Take 10 mg by mouth daily.     PROAIR HFA 108 (90 Base) MCG/ACT inhaler Inhale into the lungs as needed.     SIMBRINZA 1-0.2 % SUSP Apply 1 drop to eye 2 (two) times daily.     simvastatin (ZOCOR) 40 MG tablet Take by mouth.     traZODone (DESYREL) 100 MG tablet Take 200 mg by mouth at bedtime as needed.     TRULICITY 1.5 HW/3.8UE SOPN Inject into the skin once a week.     No current facility-administered medications on file prior to visit.      Objective:     Vitals:   09/06/22 1433  BP: 138/80  Pulse: 80   Filed Weights   09/06/22 1433  Weight: 199 lb (90.3 kg)                        Assessment:    [id number] Patient Active Problem List   Diagnosis Date Noted   Primary osteoarthritis of right knee 11/06/2017   Diabetes mellitus type 2, uncomplicated ([place]) 17/91/5056   Obesity (BMI 35.0-39.9 without comorbidity) 10/17/2017     1. Postmenopausal bleeding     Patient states episodes of bleeding have significantly declined.  Even when she bleeds it is only a very small spot.   Plan:            1.  We have once again discussed the risk for endometrial cancer and the inability to do endometrial biopsy because of her previous ablation.  The normal progress of doing a hysterectomy has been again discussed and the patient has declined this option.  MRI has been presented as an option although I do not know if there will be diagnostic enough.  She says that she is comfortable with her decision and would like to continue to monitor her bleeding and not move forward with further workup or hysterectomy. She does state that she has uterine descensus and some cystocele involved and that if this becomes significantly worse she will reconsider hysterectomy. Orders No  orders of the defined types were placed in this encounter.   No orders of the defined types were placed in this encounter.     F/U  Return in about 6 months (around 03/08/2023). I spent 24 minutes involved in the care of this patient preparing to see the patient by obtaining and reviewing her medical history (including labs, imaging tests and prior procedures), documenting clinical information in the electronic health record (EHR), counseling and coordinating care plans, writing and sending prescriptions, ordering tests or procedures and in direct communicating with the patient and medical staff discussing pertinent items from her history and physical exam.  Finis Bud, M.D. 09/06/2022 3:01 PM

## 2022-09-06 NOTE — Progress Notes (Signed)
Patient here for 6 month follow up for PMB. She has had no more vaginal bleeding, but has been seeing a urologist. She currently has a kidney stone. No other issues from patient at this time.

## 2022-10-17 NOTE — Progress Notes (Unsigned)
10/18/2022 5:46 PM   Rachel Valenzuela 03/01/1961 [id number]  Referring provider: Marguerita Merles, [place],  [place] [address]  Urological history: 1. STI -trichomonas 03/2021  2. OAB -Contributing factors of age, vaginal atrophy, diabetes and artificial sweeteners -PVR *** mL  3. High risk hematuria -non-smoker -CTU (11/2021) - bilateral nephrolithiasis measuring up to 7 mm on the right -cysto (11/2021) - NED -no reports of gross heme -UA ***  4.  Nephrolithiasis -Bilateral nephrolithiasis seen on CT urogram (11/2021)  HPI: Rachel Valenzuela is a 62 y.o. female who presents today for 3 months follow up after trial of Myrbetriq.  CATH UA ***  PVR ***  PMH: Past Medical History:  Diagnosis Date   Asthma    Diabetes mellitus without complication ([facility])    Hypertension    Osteoarthritis     Surgical History: Past Surgical History:  Procedure Laterality Date   ABLATION      Home Medications:  Allergies as of 10/18/2022       Reactions   Ramipril Anaphylaxis, Hives   Sulfa Antibiotics    Other reaction(s): Other (See Comments)        Medication List        Accurate as of October 17, 2022  5:46 PM. If you have any questions, ask your nurse or doctor.          amLODipine 10 MG tablet Commonly known as: NORVASC Take 10 mg by mouth daily.   atenolol 100 MG tablet Commonly known as: TENORMIN Take 100 mg by mouth daily.   azelastine 0.1 % nasal spray Commonly known as: ASTELIN SMARTSIG:1-2 Puff(s) Both Nares Twice Daily   Breo Ellipta 200-25 MCG/ACT Aepb Generic drug: fluticasone furoate-vilanterol Inhale 1 puff into the lungs daily.   cyclobenzaprine 5 MG tablet Commonly known as: FLEXERIL Take 5 mg by mouth 3 (three) times daily as needed for muscle spasms.   FLUoxetine 40 MG capsule Commonly known as: PROZAC Take 40 mg by mouth daily.   hydrALAZINE 100 MG tablet Commonly known as: APRESOLINE Take 100 mg by mouth 2  (two) times daily.   ibuprofen 800 MG tablet Commonly known as: ADVIL Take by mouth as needed.   Januvia 100 MG tablet Generic drug: sitaGLIPtin Take 100 mg by mouth daily.   latanoprost 0.005 % ophthalmic solution Commonly known as: XALATAN 1 drop 2 (two) times daily.   levocetirizine 5 MG tablet Commonly known as: XYZAL Take 5 mg by mouth daily.   levothyroxine 150 MCG tablet Commonly known as: SYNTHROID Take 150 mcg by mouth daily.   meclizine 25 MG tablet Commonly known as: ANTIVERT Take by mouth 3 (three) times daily as needed.   meloxicam 15 MG tablet Commonly known as: MOBIC Take 15 mg by mouth daily.   mirabegron ER 50 MG Tb24 tablet Commonly known as: MYRBETRIQ Take 1 tablet (50 mg total) by mouth daily.   montelukast 10 MG tablet Commonly known as: SINGULAIR Take 10 mg by mouth daily.   ProAir HFA 108 (90 Base) MCG/ACT inhaler Generic drug: albuterol Inhale into the lungs as needed.   Simbrinza 1-0.2 % Susp Generic drug: Brinzolamide-Brimonidine Apply 1 drop to eye 2 (two) times daily.   simvastatin 40 MG tablet Commonly known as: ZOCOR Take by mouth.   traZODone 100 MG tablet Commonly known as: DESYREL Take 200 mg by mouth at bedtime as needed.   Trulicity 1.5 RC/1.6LA Sopn Generic drug: Dulaglutide Inject into the skin  once a week.   Vitamin D 50 MCG (2000 UT) Caps Take by mouth daily.        Allergies:  Allergies  Allergen Reactions   Ramipril Anaphylaxis and Hives   Sulfa Antibiotics     Other reaction(s): Other (See Comments)    Family History: Family History  Problem Relation Age of Onset   Breast cancer Neg Hx     Social History:  reports that she has never smoked. She has never used smokeless tobacco. She reports that she does not drink alcohol and does not use drugs.  ROS: Pertinent ROS in HPI  Physical Exam: There were no vitals taken for this visit.  Constitutional:  Well nourished. Alert and oriented, No acute  distress. HEENT: [place] AT, moist mucus membranes.  Trachea midline, no masses. Cardiovascular: No clubbing, cyanosis, or edema. Respiratory: Normal respiratory effort, no increased work of breathing. GU: No CVA tenderness.  No bladder fullness or masses. Vulvovaginal atrophy w/ pallor, loss of rugae, introital retraction, excoriations.  Vulvar thinning, fusion of labia, clitoral hood retraction, prominent urethral meatus.   *** external genitalia, *** pubic hair distribution, no lesions.  Normal urethral meatus, no lesions, no prolapse, no discharge.   No urethral masses, tenderness and/or tenderness. No bladder fullness, tenderness or masses. *** vagina mucosa, *** estrogen effect, no discharge, no lesions, *** pelvic support, *** cystocele and *** rectocele noted.  No cervical motion tenderness.  Uterus is freely mobile and non-fixed.  No adnexal/parametria masses or tenderness noted.  Anus and perineum are without rashes or lesions.   ***  Neurologic: Grossly intact, no focal deficits, moving all 4 extremities. Psychiatric: Normal mood and affect.    Laboratory Data: See Epic and HPI I have reviewed the labs.    Pertinent Imaging: N/A   Assessment & Plan:    1. High risk hematuria -non-smoker -work up 02/2022 - nephrolithiasis -no reports of gross heme -UA w/ micro heme, but we are sending it for culture, she is asymptomatic at this time, so we will not treat w/ antibiotics -will recheck when she returns in three months w/ a cath UA   2. OAB -continue w/ Myrbetriq 50 mg daily -We will return 3 month   3. Nephrolithiasis -bilateral nephrolithiasis -asymptomatic at today's visit   No follow-ups on file.  These notes generated with voice recognition software. I apologize for typographical errors.  [place], [place] [address]  [place], [place] [address] [phone]

## 2022-10-18 ENCOUNTER — Encounter: Payer: Self-pay | Admitting: Urology

## 2022-10-18 ENCOUNTER — Ambulatory Visit (INDEPENDENT_AMBULATORY_CARE_PROVIDER_SITE_OTHER): Payer: 59 | Admitting: Urology

## 2022-10-18 VITALS — BP 161/91 | HR 77 | Ht 61.0 in | Wt 199.0 lb

## 2022-10-18 DIAGNOSIS — R319 Hematuria, unspecified: Secondary | ICD-10-CM

## 2022-10-18 DIAGNOSIS — N200 Calculus of kidney: Secondary | ICD-10-CM | POA: Diagnosis not present

## 2022-10-18 DIAGNOSIS — N3281 Overactive bladder: Secondary | ICD-10-CM | POA: Diagnosis not present

## 2022-10-18 LAB — URINALYSIS, COMPLETE
Bilirubin, UA: NEGATIVE
Ketones, UA: NEGATIVE
Leukocytes,UA: NEGATIVE
Nitrite, UA: NEGATIVE
Protein,UA: NEGATIVE
RBC, UA: NEGATIVE
Specific Gravity, UA: 1.02 (ref 1.005–1.030)
Urobilinogen, Ur: 0.2 mg/dL (ref 0.2–1.0)
pH, UA: 7 (ref 5.0–7.5)

## 2022-10-18 LAB — MICROSCOPIC EXAMINATION

## 2022-10-18 NOTE — Patient Instructions (Signed)
Follow up here in 6 months.  

## 2023-01-10 ENCOUNTER — Other Ambulatory Visit: Payer: Self-pay | Admitting: Family Medicine

## 2023-01-10 DIAGNOSIS — Z1231 Encounter for screening mammogram for malignant neoplasm of breast: Secondary | ICD-10-CM

## 2023-04-16 NOTE — Progress Notes (Deleted)
04/17/2023 10:58 AM   Rachel Valenzuela 1961-03-21 [id number]  Referring provider: Leanna Sato, MD [address] [place],  [place] [address]  Urological history: 1. STI -trichomonas 03/2021  2. OAB -Contributing factors of age, vaginal atrophy, diabetes and artificial sweeteners -Myrbetriq 50 mg daily  3. High risk hematuria -non-smoker -CTU (11/2021) - bilateral nephrolithiasis measuring up to 7 mm on the right -cysto (11/2021) - NED  4.  Nephrolithiasis -Bilateral nephrolithiasis seen on CT urogram (11/2021)  HPI: Rachel Valenzuela is a 62 y.o. female who presents today for 6 months follow up after trial of Myrbetriq.  Previous records reviewed.   PVR ***   PMH: Past Medical History:  Diagnosis Date   Asthma    Diabetes mellitus without complication ([facility])    Hypertension    Osteoarthritis     Surgical History: Past Surgical History:  Procedure Laterality Date   ABLATION      Home Medications:  Allergies as of 04/17/2023       Reactions   Ramipril Anaphylaxis, Hives   Sulfa Antibiotics    Other reaction(s): Other (See Comments)        Medication List        Accurate as of April 16, 2023 10:58 AM. If you have any questions, ask your nurse or doctor.          amLODipine 10 MG tablet Commonly known as: NORVASC Take 10 mg by mouth daily.   atenolol 100 MG tablet Commonly known as: TENORMIN Take 100 mg by mouth daily.   azelastine 0.1 % nasal spray Commonly known as: ASTELIN SMARTSIG:1-2 Puff(s) Both Nares Twice Daily   Breo Ellipta 200-25 MCG/INH Aepb Generic drug: fluticasone furoate-vilanterol Inhale 1 puff into the lungs daily.   cyclobenzaprine 5 MG tablet Commonly known as: FLEXERIL Take 5 mg by mouth 3 (three) times daily as needed for muscle spasms.   FLUoxetine 40 MG capsule Commonly known as: PROZAC Take 40 mg by mouth daily.   hydrALAZINE 100 MG tablet Commonly known as: APRESOLINE Take 100 mg by mouth 2 (two) times  daily.   ibuprofen 800 MG tablet Commonly known as: ADVIL Take by mouth as needed.   Januvia 100 MG tablet Generic drug: sitaGLIPtin Take 100 mg by mouth daily.   latanoprost 0.005 % ophthalmic solution Commonly known as: XALATAN 1 drop 2 (two) times daily.   levocetirizine 5 MG tablet Commonly known as: XYZAL Take 5 mg by mouth daily.   levothyroxine 150 MCG tablet Commonly known as: SYNTHROID Take 150 mcg by mouth daily.   meclizine 25 MG tablet Commonly known as: ANTIVERT Take by mouth 3 (three) times daily as needed.   meloxicam 15 MG tablet Commonly known as: MOBIC Take 15 mg by mouth daily.   mirabegron ER 50 MG Tb24 tablet Commonly known as: MYRBETRIQ Take 1 tablet (50 mg total) by mouth daily.   montelukast 10 MG tablet Commonly known as: SINGULAIR Take 10 mg by mouth daily.   ProAir HFA 108 (90 Base) MCG/ACT inhaler Generic drug: albuterol Inhale into the lungs as needed.   Simbrinza 1-0.2 % Susp Generic drug: Brinzolamide-Brimonidine Apply 1 drop to eye 2 (two) times daily.   simvastatin 40 MG tablet Commonly known as: ZOCOR Take by mouth.   traZODone 100 MG tablet Commonly known as: DESYREL Take 200 mg by mouth at bedtime as needed.   Trulicity 1.5 MG/0.5ML Sopn Generic drug: Dulaglutide Inject into the skin once a week.  Vitamin D 50 MCG (2000 UT) Caps Take by mouth daily.        Allergies:  Allergies  Allergen Reactions   Ramipril Anaphylaxis and Hives   Sulfa Antibiotics     Other reaction(s): Other (See Comments)    Family History: Family History  Problem Relation Age of Onset   Breast cancer Neg Hx     Social History:  reports that she has never smoked. She has never been exposed to tobacco smoke. She has never used smokeless tobacco. She reports that she does not drink alcohol and does not use drugs.  ROS: Pertinent ROS in HPI  Physical Exam: There were no vitals taken for this visit.  Constitutional:  Well  nourished. Alert and oriented, No acute distress. HEENT: [place] AT, moist mucus membranes.  Trachea midline, no masses. Cardiovascular: No clubbing, cyanosis, or edema. Respiratory: Normal respiratory effort, no increased work of breathing. GU: No CVA tenderness.  No bladder fullness or masses. Vulvovaginal atrophy w/ pallor, loss of rugae, introital retraction, excoriations.  Vulvar thinning, fusion of labia, clitoral hood retraction, prominent urethral meatus.   *** external genitalia, *** pubic hair distribution, no lesions.  Normal urethral meatus, no lesions, no prolapse, no discharge.   No urethral masses, tenderness and/or tenderness. No bladder fullness, tenderness or masses. *** vagina mucosa, *** estrogen effect, no discharge, no lesions, *** pelvic support, *** cystocele and *** rectocele noted.  No cervical motion tenderness.  Uterus is freely mobile and non-fixed.  No adnexal/parametria masses or tenderness noted.  Anus and perineum are without rashes or lesions.   ***  Neurologic: Grossly intact, no focal deficits, moving all 4 extremities. Psychiatric: Normal mood and affect.    Laboratory Data: N/A  Pertinent Imaging: ***   Assessment & Plan:    1. High risk hematuria -non-smoker -work up 02/2022 - nephrolithiasis -no reports of gross heme   2. OAB -We discussed trying a different medication, but she does not want to take anything that would give her dry mouth as she has dry mouth already -She will continue the Myrbetriq until she follows up in 6 months and then we will reassess as she is more focused on getting the umbilical hernia addressed -continue w/ Myrbetriq 50 mg daily  3. Nephrolithiasis -bilateral nephrolithiasis -asymptomatic at today's visit   No follow-ups on file.  These notes generated with voice recognition software. I apologize for typographical errors.  Cloretta Ned  [facility] [address]  [address] [place], [place] [address] [phone]

## 2023-04-17 ENCOUNTER — Ambulatory Visit: Payer: 59 | Admitting: Urology

## 2023-04-17 DIAGNOSIS — N200 Calculus of kidney: Secondary | ICD-10-CM

## 2023-04-17 DIAGNOSIS — R319 Hematuria, unspecified: Secondary | ICD-10-CM

## 2023-04-17 DIAGNOSIS — N3281 Overactive bladder: Secondary | ICD-10-CM

## 2023-04-23 ENCOUNTER — Other Ambulatory Visit: Payer: Self-pay | Admitting: Urology

## 2023-04-23 DIAGNOSIS — N200 Calculus of kidney: Secondary | ICD-10-CM

## 2023-04-23 NOTE — Progress Notes (Signed)
04/24/2023 2:19 PM   Rachel Valenzuela 04/13/1961 [id number]  Referring provider: Leanna Sato, MD [address] [place],  [place] [address]  Urological history: 1. STI -trichomonas 03/2021  2. OAB -Contributing factors of age, vaginal atrophy, diabetes and artificial sweeteners -Myrbetriq 50 mg daily  3. High risk hematuria -non-smoker -CTU (11/2021) - bilateral nephrolithiasis measuring up to 7 mm on the right -cysto (11/2021) - NED  4.  Nephrolithiasis -Bilateral nephrolithiasis seen on CT urogram (11/2021)  HPI: Rachel Valenzuela is a 62 y.o. female who presents today for 6 months follow up after trial of Myrbetriq.  Previous records reviewed.   She has been having 1-7 daytime urinations, 3 or more episodes of nocturia and a strong urge to urinate.  She is having urge incontinence.  She is leaking 3 more times a week.  She wears 1 absorbent pad daily.  She does not limit fluid intake.  She does engage in toilet mapping.  Since taking the Myrbetriq, she is able to take longer trips without having to stop.  She can sit through church and other social activities without having to run to the restroom constantly.  Patient denies any modifying or aggravating factors.  Patient denies any recent UTI's, gross hematuria, dysuria or suprapubic/flank pain.  Patient denies any fevers, chills, nausea or vomiting.  KUB left 6 mm stone and right 1 cm stone  PVR 0 mL    PMH: Past Medical History:  Diagnosis Date   Asthma    Diabetes mellitus without complication ([facility]CC)    Hypertension    Osteoarthritis     Surgical History: Past Surgical History:  Procedure Laterality Date   ABLATION      Home Medications:  Allergies as of 04/24/2023       Reactions   Ramipril Anaphylaxis, Hives   Sulfa Antibiotics    Other reaction(s): Other (See Comments)        Medication List        Accurate as of April 24, 2023  2:19 PM. If you have any questions, ask your nurse or doctor.           amLODipine 10 MG tablet Commonly known as: NORVASC Take 10 mg by mouth daily.   atenolol 100 MG tablet Commonly known as: TENORMIN Take 100 mg by mouth daily.   azelastine 0.1 % nasal spray Commonly known as: ASTELIN SMARTSIG:1-2 Puff(s) Both Nares Twice Daily   Breo Ellipta 200-25 MCG/INH Aepb Generic drug: fluticasone furoate-vilanterol Inhale 1 puff into the lungs daily.   cyclobenzaprine 5 MG tablet Commonly known as: FLEXERIL Take 5 mg by mouth 3 (three) times daily as needed for muscle spasms.   FLUoxetine 40 MG capsule Commonly known as: PROZAC Take 40 mg by mouth daily.   hydrALAZINE 100 MG tablet Commonly known as: APRESOLINE Take 100 mg by mouth 2 (two) times daily.   ibuprofen 800 MG tablet Commonly known as: ADVIL Take by mouth as needed.   Januvia 100 MG tablet Generic drug: sitaGLIPtin Take 100 mg by mouth daily.   latanoprost 0.005 % ophthalmic solution Commonly known as: XALATAN 1 drop 2 (two) times daily.   levocetirizine 5 MG tablet Commonly known as: XYZAL Take 5 mg by mouth daily.   levothyroxine 150 MCG tablet Commonly known as: SYNTHROID Take 150 mcg by mouth daily.   meclizine 25 MG tablet Commonly known as: ANTIVERT Take by mouth 3 (three) times daily as needed.   meloxicam 15 MG tablet  Commonly known as: MOBIC Take 15 mg by mouth daily.   mirabegron ER 50 MG Tb24 tablet Commonly known as: MYRBETRIQ Take 1 tablet (50 mg total) by mouth daily.   montelukast 10 MG tablet Commonly known as: SINGULAIR Take 10 mg by mouth daily.   ProAir HFA 108 (90 Base) MCG/ACT inhaler Generic drug: albuterol Inhale into the lungs as needed.   Simbrinza 1-0.2 % Susp Generic drug: Brinzolamide-Brimonidine Apply 1 drop to eye 2 (two) times daily.   simvastatin 40 MG tablet Commonly known as: ZOCOR Take by mouth.   traZODone 100 MG tablet Commonly known as: DESYREL Take 200 mg by mouth at bedtime as needed.   Trulicity  1.5 MG/0.5ML Sopn Generic drug: Dulaglutide Inject into the skin once a week.   Vitamin D 50 MCG (2000 UT) Caps Take by mouth daily.        Allergies:  Allergies  Allergen Reactions   Ramipril Anaphylaxis and Hives   Sulfa Antibiotics     Other reaction(s): Other (See Comments)    Family History: Family History  Problem Relation Age of Onset   Breast cancer Neg Hx     Social History:  reports that she has never smoked. She has never been exposed to tobacco smoke. She has never used smokeless tobacco. She reports that she does not drink alcohol and does not use drugs.  ROS: Pertinent ROS in HPI  Physical Exam: BP (!) 180/126   Pulse 94   Ht 5\' 1"  (1.549 m)   Wt 199 lb (90.3 kg)   BMI 37.60 kg/m   Constitutional:  Well nourished. Alert and oriented, No acute distress. HEENT: [place] AT, moist mucus membranes.  Trachea midline Cardiovascular: No clubbing, cyanosis, or edema. Respiratory: Normal respiratory effort, no increased work of breathing. Neurologic: Grossly intact, no focal deficits, moving all 4 extremities. Psychiatric: Normal mood and affect.    Laboratory Data: N/A  Pertinent Imaging:  04/24/23 13:43  Scan Result 0 ml   CLINICAL DATA:  Nephrolithiasis and flank pain.   EXAM: ABDOMEN - 1 VIEW   COMPARISON:  CT without and with contrast 12/12/2021   FINDINGS: The bowel gas pattern is normal. There is moderate fecal stasis. Advanced sigmoid diverticulosis also is visible.   There is a 10 x 6 mm oval nonobstructive caliceal stone in the inferior pole right kidney.   Two adjacent tiny nonobstructive caliceal stones are again noted in the upper pole of the left.   No new nephrolithiasis is seen. This was noted previously. No calcification is seen along the plane of the ureters. Bilateral pelvic phleboliths are again shown.   There is no supine evidence of free air. Stable visceral shadows with chronic hepatomegaly.   Lung bases are clear. There  is osteopenia and degenerative change of the lumbar spine.   IMPRESSION: 1. No acute radiographic findings. 2. Moderate fecal stasis. 3. Advanced sigmoid diverticulosis. 4. Stable bilateral nephrolithiasis. 5. Chronic hepatomegaly. 6. Osteopenia and degenerative change of the lumbar spine.     Electronically Signed   By: Almira Bar M.D.   On: 04/29/2023 22:08   Assessment & Plan:    1. High risk hematuria -non-smoker -work up 02/2022 - nephrolithiasis -no reports of gross heme   2. OAB -at goal with Myrbetriq -continue w/ Myrbetriq 50 mg daily  3. Nephrolithiasis -bilateral nephrolithiasis -asymptomatic at today's visit   Return in about 1 year (around 04/23/2024) for OAB questionnaire, PVR, KUB and UA .  These notes generated with  voice recognition software. I apologize for typographical errors.  Cloretta Ned  [facility] [address]  [address] [place], [place] [address] [phone]

## 2023-04-24 ENCOUNTER — Ambulatory Visit (INDEPENDENT_AMBULATORY_CARE_PROVIDER_SITE_OTHER): Payer: 59 | Admitting: Urology

## 2023-04-24 ENCOUNTER — Encounter: Payer: Self-pay | Admitting: Urology

## 2023-04-24 ENCOUNTER — Ambulatory Visit: Admission: RE | Admit: 2023-04-24 | Payer: 59 | Source: Ambulatory Visit

## 2023-04-24 ENCOUNTER — Ambulatory Visit
Admission: RE | Admit: 2023-04-24 | Discharge: 2023-04-24 | Disposition: A | Discharge status: Home / Self Care | Payer: 59 | Attending: Urology | Admitting: Urology

## 2023-04-24 VITALS — BP 180/126 | HR 94 | Ht 61.0 in | Wt 199.0 lb

## 2023-04-24 DIAGNOSIS — R319 Hematuria, unspecified: Secondary | ICD-10-CM

## 2023-04-24 DIAGNOSIS — N200 Calculus of kidney: Secondary | ICD-10-CM | POA: Insufficient documentation

## 2023-04-24 DIAGNOSIS — N3281 Overactive bladder: Secondary | ICD-10-CM

## 2023-04-24 LAB — BLADDER SCAN AMB NON-IMAGING: Scan Result: 0

## 2023-04-24 MED ORDER — MIRABEGRON ER 50 MG PO TB24
50.0000 mg | ORAL_TABLET | Freq: Every day | ORAL | 11 refills | Status: DC
Start: 2023-04-24 — End: 2024-04-23

## 2023-08-08 ENCOUNTER — Ambulatory Visit
Admission: RE | Admit: 2023-08-08 | Discharge: 2023-08-08 | Disposition: A | Discharge status: Home / Self Care | Payer: 59 | Source: Ambulatory Visit | Attending: Family Medicine | Admitting: Family Medicine

## 2023-08-08 DIAGNOSIS — Z1231 Encounter for screening mammogram for malignant neoplasm of breast: Secondary | ICD-10-CM | POA: Diagnosis present

## 2023-10-17 ENCOUNTER — Encounter: Payer: Self-pay | Admitting: Surgery

## 2023-10-17 ENCOUNTER — Ambulatory Visit (INDEPENDENT_AMBULATORY_CARE_PROVIDER_SITE_OTHER): Payer: 59 | Admitting: Surgery

## 2023-10-17 VITALS — BP 223/103 | HR 94 | Temp 97.9°F | Ht 62.0 in | Wt 196.2 lb

## 2023-10-17 DIAGNOSIS — K436 Other and unspecified ventral hernia with obstruction, without gangrene: Secondary | ICD-10-CM

## 2023-10-17 DIAGNOSIS — K439 Ventral hernia without obstruction or gangrene: Secondary | ICD-10-CM | POA: Diagnosis not present

## 2023-10-17 DIAGNOSIS — K469 Unspecified abdominal hernia without obstruction or gangrene: Secondary | ICD-10-CM

## 2023-10-17 NOTE — Patient Instructions (Addendum)
Your CT is scheduled for 10/22/2023 11:30 am (arrive by 11:15 am) at [facility] [address].   Belly Hernia (Ventral Hernia): What to Know  A ventral hernia is a bulge of tissue from inside the belly that pushes through a weak area of the belly. Sometimes, the bulge may have tissue from the small intestine or the large intestine. Ventral hernias do not go away without surgery. There are several types of ventral hernias. You may have: A hernia at a place where surgery was done (incisional hernia). A hernia just above the belly button (epigastric or paraumbilical hernia) or at the belly button (umbilical hernia). These can happen because of heavy lifting or straining. A hernia that comes and goes (reducible hernia). It may be visible only when you lift or strain. This type of hernia can be pushed back into the belly. A hernia that traps belly tissue inside the hernia (incarcerated hernia). This hernia cannot be pushed back into the belly. A hernia that cuts off blood flow to the tissues inside the hernia (strangulation hernia). The tissues can start to die if this happens. This is a very painful bulge that cannot be pushed back into the belly. This type of hernia is a medical emergency. What are the causes? This condition happens when tissue in the belly pushes on a weak area in the muscles. What increases the risk? You're more likely to have this condition if: You are 60 years or older. You had belly surgery in the past. This is common if there was an infection after surgery. You had an injury to the belly. You often lift or push heavy objects. You have been pregnant several times. You have long-term (chronic) health conditions that put pressure in your belly. These include: Being overweight or obese. Having a buildup of fluid inside your belly (ascites). You throw up or cough over and over again. You have trouble pooping (constipation). You strain to poop or pee. What are the  signs or symptoms? The only symptom of a ventral hernia may be a painless bulge in the belly.  Reducible hernia may be visible only when you strain, cough, or lift. Other symptoms may include: Dull pain. A feeling of pressure. Symptoms of an incarcerated hernia may include: Tenderness at hernia site. Bloating. Throwing up or feeling like you may throw up. Trouble pooping or no pooping at all. Symptoms of a strangulated hernia may include: More pain. Throwing up or feeling like you may throw up. Pain when pressing on the hernia. The skin over the hernia turning red or purple. Trouble pooping. Blood in the poop. How is this diagnosed? This condition may be diagnosed based on: Your symptoms and medical history. A physical exam. You may be asked to cough or strain while standing. These actions increase the pressure inside your belly and force the hernia through the opening in your belly. Your health care provider may try to reduce the hernia by gently pushing the hernia back in. Imaging studies, such as an ultrasound or CT scan. How is this treated? This condition is treated with surgery. If you have a strangulated hernia, surgery is done as soon as possible. If your hernia is small and not incarcerated, you may be asked to lose some weight before surgery. Follow these instructions at home: Eat and drink only as you've been told. Lose weight, if told by your provider. You may have to avoid lifting. Ask your provider how much you can safely lift. Avoid activities that increase  pressure on your hernia. Take your medicines only as told. You may need to take steps to help treat or prevent trouble pooping (constipation), such as: Taking medicines to help you poop. Eating foods high in fiber, like beans, whole grains, and fresh fruits and vegetables. Drinking more fluids as told. Ask your provider if it's safe to drive or use machines while taking your medicine. Contact a health care  provider if: Your hernia gets larger or feels hard. Your hernia becomes painful. Get help right away if: Your hernia becomes very painful. You have pain along with any of these: Changes in skin color in the area of the hernia. Feeling like throwing up. Throwing up. Fever. These symptoms may be an emergency. Call 911 right away. Do not wait to see if the symptoms will go away. Do not drive yourself to the hospital. This information is not intended to replace advice given to you by your health care provider. Make sure you discuss any questions you have with your health care provider. Document Revised: 03/13/2023 Document Reviewed: 03/13/2023 Elsevier Patient Education  2024 [employer].

## 2023-10-17 NOTE — Progress Notes (Signed)
10/17/2023  Reason for Visit:  Ventral hernias  Requesting Provider:  Darreld Mclean, MD  History of Present Illness: Rachel Valenzuela is a 63 y.o. female presenting for evaluation of ventral hernias.  The patient reports a long history of ventral hernias, which at first were small but she feels have been growing more recently.  She had seen Dr. Tonna Boehringer on 08/20/23.  He recommended weight loss given the complexity of the hernias and diastasis so there are less complications from the repair standpoint.  She patient reports she was told future repair would have to be a long incision and was hoping this could be done via laparoscopy.  Her son-in-law had robotic inguinal hernia repair with me and she would like something similar done if possible.  She reports discomfort at her hernia sites which comes and goes, particularly if she's doing anything strenuous.  Denies any period when her pain is unbearable.  Denies any nausea or vomiting.  She had a CT scan in March 2023 at which time 3 main hernias were noted.  She reports that she has been losing weight since around November 2024.  She is down to 196 lbs today.  However, on recent visit with her PCP, her HgA1c was checked and was elevated to 7.8, up from 7.3 on her previous check.  Past Medical History: Past Medical History:  Diagnosis Date   Asthma    Diabetes mellitus without complication (HCC)    Hypertension    Osteoarthritis      Past Surgical History: Past Surgical History:  Procedure Laterality Date   ABLATION      Home Medications: Prior to Admission medications   Medication Sig Start Date End Date Taking? Authorizing Provider  amLODipine (NORVASC) 10 MG tablet Take 10 mg by mouth daily. 02/01/21  Yes [provider]  atenolol (TENORMIN) 100 MG tablet Take 100 mg by mouth daily. 02/01/21  Yes [provider]  azelastine (ASTELIN) 0.1 % nasal spray SMARTSIG:1-2 Puff(s) Both Nares Twice Daily 03/03/21  Yes [provider]  BREO ELLIPTA 200-25 MCG/INH AEPB Inhale 1 puff into the lungs daily. 02/01/21  Yes [provider]  Cholecalciferol (VITAMIN D) 50 MCG (2000 UT) CAPS Take by mouth daily. 11/22/20  Yes [provider]  cyclobenzaprine (FLEXERIL) 5 MG tablet Take 5 mg by mouth 3 (three) times daily as needed for muscle spasms.   Yes [provider]  FLUoxetine (PROZAC) 40 MG capsule Take 40 mg by mouth daily. 11/10/20  Yes [provider]  hydrALAZINE (APRESOLINE) 100 MG tablet Take 100 mg by mouth 2 (two) times daily. 03/28/21  Yes [provider]  ibuprofen (ADVIL) 800 MG tablet Take by mouth as needed.   Yes [provider]  JANUVIA 100 MG tablet Take 100 mg by mouth daily. 02/01/21  Yes [provider]  latanoprost (XALATAN) 0.005 % ophthalmic solution 1 drop 2 (two) times daily. 10/21/20  Yes [provider]  levocetirizine (XYZAL) 5 MG tablet Take 5 mg by mouth daily. 02/01/21  Yes [provider]  levothyroxine (SYNTHROID) 150 MCG tablet Take 150 mcg by mouth daily. 02/01/21  Yes [provider]  meclizine (ANTIVERT) 25 MG tablet Take by mouth 3 (three) times daily as needed.   Yes [provider]  meloxicam (MOBIC) 15 MG tablet Take 15 mg by mouth daily. 02/02/21  Yes [provider]  mirabegron ER (MYRBETRIQ) 50 MG TB24 tablet Take 1 tablet (50 mg total) by mouth daily. 04/24/23  Yes McGowan, Shannon A, PA-C  montelukast (SINGULAIR) 10 MG tablet Take 10 mg by mouth daily. 02/01/21  Yes [provider]  PROAIR HFA 108 (90 Base) MCG/ACT inhaler Inhale into the lungs as needed. 03/28/21  Yes [provider]  SIMBRINZA 1-0.2 % SUSP Apply 1 drop to eye 2 (two) times daily. 10/21/20  Yes [provider]  simvastatin (ZOCOR) 40 MG tablet Take by mouth.   Yes [provider]  traZODone (DESYREL) 100 MG tablet Take 200 mg by mouth at bedtime as needed. 11/10/20  Yes [provider]  TRULICITY 1.5 MG/0.5ML SOPN Inject into the skin once a week. 01/27/21  Yes [provider]    Allergies: Allergies  Allergen Reactions   Ramipril Anaphylaxis and Hives   Sulfa Antibiotics     Other reaction(s): Other (See Comments)    Social History:  reports that she has never smoked. She has never been exposed to tobacco smoke. She has never used smokeless tobacco. She reports that she does not drink alcohol and does not use drugs.   Family History: Family History  Problem Relation Age of Onset   Breast cancer Neg Hx     Review of Systems: Review of Systems  Constitutional:  Negative for chills and fever.  Respiratory:  Negative for shortness of breath.   Cardiovascular:  Negative for chest pain.  Gastrointestinal:  Positive for abdominal pain (at her hernia sites). Negative for nausea and vomiting.    Physical Exam BP (!) 223/103   Pulse 94   Temp 97.9 F (36.6 C) (Oral)   Ht 5\' 2"  (1.575 m)   Wt 196 lb 3.2 oz (89 kg)   SpO2 93%   BMI 35.89 kg/m  CONSTITUTIONAL: No acute distress HEENT:  Normocephalic, atraumatic, extraocular motion intact. RESPIRATORY:  Lungs are clear, and breath sounds are equal bilaterally. Normal respiratory effort without pathologic use of accessory muscles. CARDIOVASCULAR: Heart is regular without murmurs, gallops, or rubs. GI: The abdomen is soft, obese, non-distended, with mild tenderness to palpation at her inferior hernia site.  Overall the patient has three main hernia components -- a reducible component above umbilicus containing part of transverse colon, a reducible small component at the umbilicus itself, and an incarcerated component below the umbilicus likely containing fat. MUSCULOSKELETAL:  Normal muscle strength and tone in all four extremities.  No peripheral edema or cyanosis. NEUROLOGIC:  Motor and sensation is grossly normal.  Cranial nerves are grossly intact. PSYCH:  Alert and oriented to person,  place and time. Affect is normal.  Laboratory Analysis: Labs from 09/21/23: HgA1c 7.8  Imaging: CT abdomen/pelvis on 12/12/21: IMPRESSION: 1. Nonobstructive bilateral nephrolithiasis measuring up to 7 mm on the right. No obstructive ureteral or bladder calculi. 2. No solid enhancing renal mass. 3. Extensive colonic diverticulosis without findings of acute diverticulitis. 4. Hepatic steatosis. 5. Multiple fat containing ventral hernias 1 of which also contains a nonobstructed portion of transverse colon. 6. Uterine leiomyomas. 7. Small hiatal hernia. 8.  Aortic Atherosclerosis (ICD10-I70.0).  Assessment and Plan: This is a 63 y.o. female with ventral hernias  --Discussed with the patient that I agree with Dr. Tonna Boehringer that weight loss will only be helpful in the setting of hernia repair to decrease the risk of recurrence and complications.  But also very important is her diabetes control.  Her A1c is elevated to 7.8, which is slightly higher than a few months ago.  Discussed with her how uncontrolled diabetes can lead also to surgical  complications including wound infection, recurrence, mesh issues.  As such, for now, it would be better to hold off on any surgical intervention until both her weight and diabetes are better.  She will continue trying to lose weight and will be more compliant with her diet. --She will follow up with me in 3 months to reassess.  In the meantime, will order repeat CT scan of her abdomen/pelvis to evaluate better the current hernias and anatomy to see if a robotic approach would be feasible. --All of her questions have been answered.  I spent 30 minutes dedicated to the care of this patient on the date of this encounter to include pre-visit review of records, face-to-face time with the patient discussing diagnosis and management, and any post-visit coordination of care.   Howie Ill, MD  [facility]

## 2023-10-22 ENCOUNTER — Ambulatory Visit
Admission: RE | Admit: 2023-10-22 | Discharge: 2023-10-22 | Disposition: A | Discharge status: Home / Self Care | Payer: 59 | Source: Ambulatory Visit | Attending: Surgery | Admitting: Surgery

## 2023-10-22 DIAGNOSIS — K469 Unspecified abdominal hernia without obstruction or gangrene: Secondary | ICD-10-CM | POA: Diagnosis present

## 2023-10-22 LAB — POCT I-STAT CREATININE: Creatinine, Ser: 0.8 mg/dL (ref 0.44–1.00)

## 2023-10-22 MED ORDER — IOHEXOL 300 MG/ML  SOLN
100.0000 mL | Freq: Once | INTRAMUSCULAR | Status: AC | PRN
  Administered 2023-10-22: 100 mL via INTRAVENOUS

## 2023-11-01 ENCOUNTER — Other Ambulatory Visit: Payer: Self-pay

## 2023-11-01 DIAGNOSIS — R911 Solitary pulmonary nodule: Secondary | ICD-10-CM

## 2023-11-06 ENCOUNTER — Ambulatory Visit
Admission: RE | Admit: 2023-11-06 | Discharge: 2023-11-06 | Disposition: A | Discharge status: Home / Self Care | Payer: 59 | Source: Ambulatory Visit | Attending: Surgery | Admitting: Surgery

## 2023-11-06 DIAGNOSIS — R911 Solitary pulmonary nodule: Secondary | ICD-10-CM | POA: Insufficient documentation

## 2023-11-06 MED ORDER — IOHEXOL 300 MG/ML  SOLN
75.0000 mL | Freq: Once | INTRAMUSCULAR | Status: AC | PRN
  Administered 2023-11-06: 75 mL via INTRAVENOUS

## 2023-11-21 ENCOUNTER — Encounter: Payer: Self-pay | Admitting: Surgery

## 2023-11-21 ENCOUNTER — Telehealth: Payer: Self-pay | Admitting: Surgery

## 2023-11-21 NOTE — Telephone Encounter (Signed)
 Please call patient, she is anxiously awaiting her CT abd/pelvis results.  Ct done 10/22/23.  Thank you.

## 2024-01-14 ENCOUNTER — Encounter: Payer: Self-pay | Admitting: Surgery

## 2024-01-14 ENCOUNTER — Ambulatory Visit (INDEPENDENT_AMBULATORY_CARE_PROVIDER_SITE_OTHER): Admitting: Surgery

## 2024-01-14 VITALS — BP 157/75 | HR 91 | Temp 98.1°F | Ht 62.0 in | Wt 199.4 lb

## 2024-01-14 DIAGNOSIS — R911 Solitary pulmonary nodule: Secondary | ICD-10-CM | POA: Diagnosis not present

## 2024-01-14 DIAGNOSIS — K436 Other and unspecified ventral hernia with obstruction, without gangrene: Secondary | ICD-10-CM | POA: Diagnosis not present

## 2024-01-14 NOTE — Progress Notes (Signed)
 01/14/2024  History of Present Illness: Rachel Valenzuela is a 63 y.o. female presenting for follow up of ventral hernias.  She was last seen on 10/17/23 for initial consult.  She had a CT of abdomen and pelvis which showed three ventral hernias, one above umbilicus containing transverse colon, one at the umbilicus containing fat, and one below the umbilicus containing fat.  On exam, the inferior hernia was incarcerated while the two superior were reducible.  Her A1c was elevated to 7.8 up from prior check of 7.3.  As such it was decided to hold off on surgery until her diabetes was better controlled.  Today, she reports that her ventral hernias remain stable.  There's some discomfort at times but no severe pain.  She feels her bowel movements have improved.  She also feels her blood glucose is doing better than before, still elevated but better.  She has follow up with Dr. Willene Harper in two weeks.    Of note, her CT abdomen/pelvis also saw small pulmonary nodules, and a dedicated chest CT was obtained on 11/06/23 which showed multiple bilateral lung nodules, but two were more suspicious measuring 7 mm and 6 mm in the right apex and middle lobes.  Recommendation was made for repeat CT in 4-6 months.  She's interested in going ahead and ordering and scheduling the CT so it does not get overlooked.  Past Medical History: Past Medical History:  Diagnosis Date   Asthma    Diabetes mellitus without complication (HCC)    Hyperlipidemia    Hypertension    Hypothyroid    Osteoarthritis      Past Surgical History: Past Surgical History:  Procedure Laterality Date   ABDOMINAL HYSTERECTOMY  2011   ABLATION      Home Medications: Prior to Admission medications   Medication Sig Start Date End Date Taking? Authorizing Provider  amLODipine (NORVASC) 10 MG tablet Take 10 mg by mouth daily. 02/01/21  Yes [provider]  atenolol (TENORMIN) 100 MG tablet Take 100 mg by mouth daily. 02/01/21  Yes [provider]  azelastine (ASTELIN) 0.1 % nasal spray SMARTSIG:1-2 Puff(s) Both Nares Twice Daily 03/03/21  Yes [provider]  BREO ELLIPTA 200-25 MCG/INH AEPB Inhale 1 puff into the lungs daily. 02/01/21  Yes [provider]  Cholecalciferol (VITAMIN D) 50 MCG (2000 UT) CAPS Take by mouth daily. 11/22/20  Yes [provider]  cyclobenzaprine (FLEXERIL) 5 MG tablet Take 5 mg by mouth 3 (three) times daily as needed for muscle spasms.   Yes [provider]  FLUoxetine (PROZAC) 40 MG capsule Take 40 mg by mouth daily. 11/10/20  Yes [provider]  hydrALAZINE (APRESOLINE) 100 MG tablet Take 100 mg by mouth 2 (two) times daily. 03/28/21  Yes [provider]  ibuprofen (ADVIL) 800 MG tablet Take by mouth as needed.   Yes [provider]  JANUVIA 100 MG tablet Take 100 mg by mouth daily. 02/01/21  Yes [provider]  latanoprost (XALATAN) 0.005 % ophthalmic solution 1 drop 2 (two) times daily. 10/21/20  Yes [provider]  levocetirizine (XYZAL) 5 MG tablet Take 5 mg by mouth daily. 02/01/21  Yes [provider]  levothyroxine (SYNTHROID) 150 MCG tablet Take 150 mcg by mouth daily. 02/01/21  Yes [provider]  meclizine (ANTIVERT) 25 MG tablet Take by mouth 3 (three) times daily as needed.   Yes [provider]  meloxicam  (MOBIC ) 15 MG tablet Take 15 mg by mouth  daily. 02/02/21  Yes [provider]  mirabegron  ER (MYRBETRIQ ) 50 MG TB24 tablet Take 1 tablet (50 mg total) by mouth daily. 04/24/23  Yes McGowan, Cathleen Coach A, PA-C  montelukast (SINGULAIR) 10 MG tablet Take 10 mg by mouth daily. 02/01/21  Yes [provider]  PROAIR HFA 108 (90 Base) MCG/ACT inhaler Inhale into the lungs as needed. 03/28/21  Yes [provider]  SIMBRINZA 1-0.2 % SUSP Apply 1 drop to eye 2 (two) times daily. 10/21/20  Yes [provider]  simvastatin (ZOCOR) 40 MG tablet Take by mouth.   Yes  [provider]  traZODone (DESYREL) 100 MG tablet Take 200 mg by mouth at bedtime as needed. 11/10/20  Yes [provider]  TRULICITY 1.5 MG/0.5ML SOPN Inject into the skin once a week. 01/27/21  Yes [provider]    Allergies: Allergies  Allergen Reactions   Ramipril Anaphylaxis and Hives   Sulfa Antibiotics     Other reaction(s): Other (See Comments)    Review of Systems: Review of Systems  Constitutional:  Negative for chills and fever.  Respiratory:  Negative for shortness of breath.   Cardiovascular:  Negative for chest pain.  Gastrointestinal:  Negative for abdominal pain, nausea and vomiting.    Physical Exam BP (!) 157/75   Pulse 91   Temp 98.1 F (36.7 C) (Oral)   Ht 5\' 2"  (1.575 m)   Wt 199 lb 6.4 oz (90.4 kg)   SpO2 92%   BMI 36.47 kg/m  CONSTITUTIONAL: No acute distress HEENT:  Normocephalic, atraumatic, extraocular motion intact. RESPIRATORY:  Lungs are clear, and breath sounds are equal bilaterally. Normal respiratory effort without pathologic use of accessory muscles. CARDIOVASCULAR: Heart is regular without murmurs, gallops, or rubs. GI: The abdomen is soft, non-distended, non-tender but with some mild soreness to palpation at the inferior hernia.  The two superior hernias remain reducible and the inferior remains incarcerated.  No evidence for strangulation.  NEUROLOGIC:  Motor and sensation is grossly normal.  Cranial nerves are grossly intact. PSYCH:  Alert and oriented to person, place and time. Affect is normal.  Labs/Imaging: CT chest on 11/06/23: IMPRESSION: 1. 7 mm right apical lung nodule and 6 mm right middle lobe nodule are indeterminate. Recommend follow-up noncontrast chest CT in 4-6 months. 2. Small scattered sub 4 mm nodules are likely benign. 3. No mediastinal or hilar mass or adenopathy. 4. Age advanced atherosclerotic calcifications including the coronary arteries. 5. 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 6. Aortic atherosclerosis.  CT abdomen/pelvis on 10/22/23: IMPRESSION: 1. New 13 mm right middle lobe pulmonary nodule. Suggest further evaluation with prompt dedicated chest CT. 2. Moderate-sized ventral hernia containing fat and nonobstructed portion of transverse colon. 3. Small fat containing paraumbilical hernia. 4. Large fat containing infraumbilical ventral hernia. 5. Nonobstructive bilateral renal stones. 6. Colonic diverticulosis without findings of acute diverticulitis. 7.  Aortic Atherosclerosis (ICD10-I70.0).  Assessment and Plan: This is a 63 y.o. female with ventral hernias -- one incarcerated and two reducible.  --Discussed with the patient that her exam remains stable and given that her abdominal symptoms are stable and minimal, we can continue waiting for her diabetes to be better controlled.  She has a follow up appointment with Dr. Willene Harper in two weeks, at which time her A1c will be repeated.  If this has improved, then we can proceed with scheduling her for surgery for repair of her hernias.   --With regards to the pulmonary  nodules found on CT chest, will place order for repeat CT chest in August 2025 to follow up on these nodules.  Discussed with the patient that these would not impede any surgery for her ventral hernias and these can be done first as long as her diabetes is controlled. --Follow up with me in 1 month to hopefully schedule and discuss surgical repair.  I spent 20 minutes dedicated to the care of this patient on the date of this encounter to include pre-visit review of records, face-to-face time with the patient discussing diagnosis and management, and any post-visit coordination of care.   Marene Shape, MD [facility]

## 2024-01-14 NOTE — Patient Instructions (Addendum)
 Centralized scheduling: [phone]. Call to schedule the Chest CT without contrast to be scheduled in 4 months.   Belly Hernia (Ventral Hernia): What to Know  A ventral hernia is a bulge of tissue from inside the belly that pushes through a weak area of the belly. Sometimes, the bulge may have tissue from the small intestine or the large intestine. Ventral hernias do not go away without surgery. There are several types of ventral hernias. You may have: A hernia at a place where surgery was done (incisional hernia). A hernia just above the belly button (epigastric or paraumbilical hernia) or at the belly button (umbilical hernia). These can happen because of heavy lifting or straining. A hernia that comes and goes (reducible hernia). It may be visible only when you lift or strain. This type of hernia can be pushed back into the belly. A hernia that traps belly tissue inside the hernia (incarcerated hernia). This hernia cannot be pushed back into the belly. A hernia that cuts off blood flow to the tissues inside the hernia (strangulation hernia). The tissues can start to die if this happens. This is a very painful bulge that cannot be pushed back into the belly. This type of hernia is a medical emergency. What are the causes? This condition happens when tissue in the belly pushes on a weak area in the muscles. What increases the risk? You're more likely to have this condition if: You are 60 years or older. You had belly surgery in the past. This is common if there was an infection after surgery. You had an injury to the belly. You often lift or push heavy objects. You have been pregnant several times. You have long-term (chronic) health conditions that put pressure in your belly. These include: Being overweight or obese. Having a buildup of fluid inside your belly (ascites). You throw up or cough over and over again. You have trouble pooping (constipation). You strain to poop or pee. What  are the signs or symptoms? The only symptom of a ventral hernia may be a painless bulge in the belly.  Reducible hernia may be visible only when you strain, cough, or lift. Other symptoms may include: Dull pain. A feeling of pressure. Symptoms of an incarcerated hernia may include: Tenderness at hernia site. Bloating. Throwing up or feeling like you may throw up. Trouble pooping or no pooping at all. Symptoms of a strangulated hernia may include: More pain. Throwing up or feeling like you may throw up. Pain when pressing on the hernia. The skin over the hernia turning red or purple. Trouble pooping. Blood in the poop. How is this diagnosed? This condition may be diagnosed based on: Your symptoms and medical history. A physical exam. You may be asked to cough or strain while standing. These actions increase the pressure inside your belly and force the hernia through the opening in your belly. Your health care provider may try to reduce the hernia by gently pushing the hernia back in. Imaging studies, such as an ultrasound or CT scan. How is this treated? This condition is treated with surgery. If you have a strangulated hernia, surgery is done as soon as possible. If your hernia is small and not incarcerated, you may be asked to lose some weight before surgery. Follow these instructions at home: Eat and drink only as you've been told. Lose weight, if told by your provider. You may have to avoid lifting. Ask your provider how much you can safely lift. Avoid activities that increase  pressure on your hernia. Take your medicines only as told. You may need to take steps to help treat or prevent trouble pooping (constipation), such as: Taking medicines to help you poop. Eating foods high in fiber, like beans, whole grains, and fresh fruits and vegetables. Drinking more fluids as told. Ask your provider if it's safe to drive or use machines while taking your medicine. Contact a health  care provider if: Your hernia gets larger or feels hard. Your hernia becomes painful. Get help right away if: Your hernia becomes very painful. You have pain along with any of these: Changes in skin color in the area of the hernia. Feeling like throwing up. Throwing up. Fever. These symptoms may be an emergency. Call 911 right away. Do not wait to see if the symptoms will go away. Do not drive yourself to the hospital. This information is not intended to replace advice given to you by your health care provider. Make sure you discuss any questions you have with your health care provider. Document Revised: 03/13/2023 Document Reviewed: 03/13/2023 Elsevier Patient Education  2024 [employer].

## 2024-02-13 ENCOUNTER — Ambulatory Visit: Admitting: Surgery

## 2024-02-23 LAB — LAB REPORT - SCANNED
Alb/Creat Ratio, Ur: 43 mg/g{creat}
Albumin, Urine POC: 24.7
Creatinine, POC: 57 mg/dL
EGFR (Non-African Amer.): 80

## 2024-03-03 ENCOUNTER — Ambulatory Visit (INDEPENDENT_AMBULATORY_CARE_PROVIDER_SITE_OTHER): Admitting: Surgery

## 2024-03-03 ENCOUNTER — Telehealth: Payer: Self-pay | Admitting: *Deleted

## 2024-03-03 ENCOUNTER — Encounter: Payer: Self-pay | Admitting: Surgery

## 2024-03-03 VITALS — BP 168/96 | HR 76 | Temp 98.3°F | Ht 62.0 in | Wt 197.0 lb

## 2024-03-03 DIAGNOSIS — K436 Other and unspecified ventral hernia with obstruction, without gangrene: Secondary | ICD-10-CM

## 2024-03-03 NOTE — Telephone Encounter (Signed)
 Faxed Medical Clearance to PCP Arn Beth at [phone]

## 2024-03-03 NOTE — Patient Instructions (Addendum)
 Please call the office and speak with Rachel Valenzuela to let us  know if you want to have surgery in July or wait until January    You have requested to have a Ventral Hernia Repair. This will be done by Dr Mauri Sous at [facility]. Please see your (BLUE) Pre-care sheet for more information. Our surgery scheduler will call you to look at surgery dates and to go over surgery information.   If you are on any injectable weight loss medication, you will need to stop taking your GLP-1 injectable (weight loss) medications 8 days before your surgery to avoid any complications with anesthesia.   You will need to arrange to be out of work for approximately 1-2 weeks and then you may return with a lifting restriction for 4 more weeks. If you have FMLA or Disability paperwork that needs to be filled out, please have your company fax your paperwork to [phone] or you may drop this by either office. This paperwork will be filled out within 3 days after your surgery has been completed.     Ventral Hernia A ventral hernia (also called an incisional hernia) is a hernia that occurs at the site of a previous surgical cut (incision) in the abdomen. The abdominal wall spans from your lower chest down to your pelvis. If the abdominal wall is weakened from a surgical incision, a hernia can occur. A hernia is a bulge of bowel or muscle tissue pushing out on the weakened part of the abdominal wall. Ventral hernias can get bigger from straining or lifting. Obese and older people are at higher risk for a ventral hernia. People who develop infections after surgery or require repeat incisions at the same site on the abdomen are also at increased risk. CAUSES  A ventral hernia occurs because of weakness in the abdominal wall at an incision site.  SYMPTOMS  Common symptoms include: A visible bulge or lump on the abdominal wall. Pain or tenderness around the lump. Increased discomfort if you cough or make a sudden movement. If the hernia  has blocked part of the intestine, a serious complication can occur (incarcerated or strangulated hernia). This can become a problem that requires emergency surgery because the blood flow to the blocked intestine may be cut off. Symptoms may include: Feeling sick to your stomach (nauseous). Throwing up (vomiting). Stomach swelling (distention) or bloating. Fever. Rapid heartbeat. DIAGNOSIS  Your health care provider will take a medical history and perform a physical exam. Various tests may be ordered, such as: Blood tests. Urine tests. Ultrasonography. X-rays. Computed tomography (CT). TREATMENT  Watchful waiting may be all that is needed for a smaller hernia that does not cause symptoms. Your health care provider may recommend the use of a supportive belt (truss) that helps to keep the abdominal wall intact. For larger hernias or those that cause pain, surgery to repair the hernia is usually recommended. If a hernia becomes strangulated, emergency surgery needs to be done right away. HOME CARE INSTRUCTIONS Avoid putting pressure or strain on the abdominal area. Avoid heavy lifting. Use good body positioning for physical tasks. Ask your health care provider about proper body positioning. Use a supportive belt as directed by your health care provider. Maintain a healthy weight. Eat foods that are high in fiber, such as whole grains, fruits, and vegetables. Fiber helps prevent difficult bowel movements (constipation). Drink enough fluids to keep your urine clear or pale yellow. Follow up with your health care provider as directed. SEEK MEDICAL CARE IF:  Your hernia seems to be getting larger or more painful. SEEK IMMEDIATE MEDICAL CARE IF:  You have abdominal pain that is sudden and sharp. Your pain becomes severe. You have repeated vomiting. You are sweating a lot. You notice a rapid heartbeat. You develop a fever. MAKE SURE YOU:  Understand these instructions. Will watch your  condition. Will get help right away if you are not doing well or get worse.     Open Ventral Hernia Repair Open ventral hernia repair is a surgery to fix a ventral hernia. A ventral hernia,  is a bulge of body tissue or intestines that pushes through the front part of the abdomen. This can happen if the connective tissue covering the muscles over the abdomen has a weak spot or is torn because of a surgical cut (incision) from a previous surgery. A ventral hernia repair is often done soon after diagnosis to stop the hernia from getting bigger, becoming uncomfortable, or becoming an emergency. This surgery usually takes about 2 hours, but the time can vary greatly.  LET [facility] CARE PROVIDER KNOW ABOUT: Any allergies you have. All medicines you are taking, including steroids, vitamins, herbs, eye drops, creams, and over-the-counter medicines. Previous problems you or members of your family have had with the use of anesthetics. Any blood disorders you have. Previous surgeries you have had. Medical conditions you have.  RISKS AND COMPLICATIONS  Generally, Open ventral hernia repair is a safe procedure. However, as with any surgical procedure, problems can occur. Possible problems include: Bleeding. Trouble passing urine or having a bowel movement after the surgery. Infection. Pneumonia. Blood clots. Pain in the area of the hernia. A bulge in the area of the hernia that may be caused by a collection of fluid. Injury to intestines or other structures in the abdomen. Return of the hernia after surgery.  BEFORE THE PROCEDURE  You may need to have blood tests, urine tests, a chest X-ray, or an electrocardiogram done before the day of the surgery. Ask your health care provider about changing or stopping your regular medicines. This is especially important if you are taking diabetes medicines or blood thinners. You may need to wash with a special type of germ-killing soap. Do not eat or  drink anything after midnight the night before the procedure or as directed by your health care provider. Make plans to have someone drive you home after the procedure.  PROCEDURE  Small monitors will be put on your body. They are used to check your heart, blood pressure, and oxygen level. An IV access tube will be put into a vein in your hand or arm. Fluids and medicine will flow directly into your body through the IV tube. You will be given medicine that makes you go to sleep (general anesthetic). Your abdomen will be cleaned with a special soap to kill any germs on your skin. Once you are asleep, a moderate - large size incision will be made in your abdomen. The size of incision depends on how large your hernia is. Your surgeon puts the tissue or intestines that formed the hernia back in place. A screen-like patch (mesh) is used to close the hernia. This helps make the area stronger. Stitches, tacks, or staples are used to keep the mesh in place. Medicine and a bandage (dressing) or skin glue will be put over the incision.  AFTER THE PROCEDURE  You will stay in a recovery area until the anesthetic wears off. Your blood pressure and pulse will  be checked often. You may be able to go home the same day or may need to stay in the hospital for 1-2 days after surgery. Your surgeon will decide when you can go home depending upon your recovery. You may feel some pain. You will be given medicine for pain. You will be urged to do breathing exercises that involve taking deep breaths. This helps prevent a lung infection after a surgery. You may have to wear compression stockings while you are in the hospital. These stockings help keep blood clots from forming in your legs.   This information is not intended to replace advice given to you by your health care provider. Make sure you discuss any questions you have with your health care provider.   Document Released: 08/21/2012 Document Revised: 09/09/2013  Document Reviewed: 08/21/2012 Elsevier Interactive Patient Education [employer]

## 2024-03-03 NOTE — Progress Notes (Signed)
 03/03/2024  History of Present Illness: Rachel Valenzuela is a 63 y.o. female presenting for follow up of her incarcerated ventral hernia.  She overall has three hernias -- superior hernia which had been reducible containing portion of transverse colon, umbilical hernia reducible containing fat, and infraumbilical hernia incarcerated containing fat.  She saw her PCP recently, and her HgA1c on 02/21/24 was down to 6.5.  She feels that her weight has remained stable but her knees continue to bother her.  She has had a few falls over the past month and she's seeing her orthopedic surgeon this week to discuss her knees.  She's also scheduled for CT chest on 04/04/24 to follow up on two lung nodules seen on her right upper and middle lobes on CT chest from 11/06/23.    From the abdominal standpoint, she is doing well.  Denies any worsening pain or worsening bulging with the hernias.  She feels that the superior hernia has remained more reduced recently as she's been taking it easier due to her knees.  Past Medical History: Past Medical History:  Diagnosis Date   Asthma    Diabetes mellitus without complication (HCC)    Hyperlipidemia    Hypertension    Hypothyroid    Osteoarthritis      Past Surgical History: Past Surgical History:  Procedure Laterality Date   ABDOMINAL HYSTERECTOMY  2011   ABLATION      Home Medications: Prior to Admission medications   Medication Sig Start Date End Date Taking? Authorizing Provider  amLODipine (NORVASC) 10 MG tablet Take 10 mg by mouth daily. 02/01/21   [provider]  atenolol (TENORMIN) 100 MG tablet Take 100 mg by mouth daily. 02/01/21   [provider]  azelastine (ASTELIN) 0.1 % nasal spray SMARTSIG:1-2 Puff(s) Both Nares Twice Daily 03/03/21   [provider]  BREO ELLIPTA 200-25 MCG/INH AEPB Inhale 1 puff into the lungs daily. 02/01/21   [provider]  Cholecalciferol (VITAMIN D) 50 MCG (2000 UT) CAPS Take by mouth  daily. 11/22/20   [provider]  cyclobenzaprine (FLEXERIL) 5 MG tablet Take 5 mg by mouth 3 (three) times daily as needed for muscle spasms.    [provider]  FLUoxetine (PROZAC) 40 MG capsule Take 40 mg by mouth daily. 11/10/20   [provider]  hydrALAZINE (APRESOLINE) 100 MG tablet Take 100 mg by mouth 2 (two) times daily. 03/28/21   [provider]  ibuprofen (ADVIL) 800 MG tablet Take by mouth as needed.    [provider]  JANUVIA 100 MG tablet Take 100 mg by mouth daily. 02/01/21   [provider]  latanoprost (XALATAN) 0.005 % ophthalmic solution 1 drop 2 (two) times daily. 10/21/20   [provider]  levocetirizine (XYZAL) 5 MG tablet Take 5 mg by mouth daily. 02/01/21   [provider]  levothyroxine (SYNTHROID) 150 MCG tablet Take 150 mcg by mouth daily. 02/01/21   [provider]  meclizine (ANTIVERT) 25 MG tablet Take by mouth 3 (three) times daily as needed.    [provider]  meloxicam  (MOBIC ) 15 MG tablet Take 15 mg by mouth daily. 02/02/21   [provider]  mirabegron  ER (MYRBETRIQ ) 50 MG TB24 tablet Take 1 tablet (50 mg total) by mouth daily. 04/24/23   Matilde Son A, PA-C  montelukast (SINGULAIR) 10 MG tablet Take 10 mg by mouth daily. 02/01/21   [provider]  PROAIR HFA 108 [phone] Base) MCG/ACT inhaler Inhale  into the lungs as needed. 03/28/21   [provider]  SIMBRINZA 1-0.2 % SUSP Apply 1 drop to eye 2 (two) times daily. 10/21/20   [provider]  simvastatin (ZOCOR) 40 MG tablet Take by mouth.    [provider]  traZODone (DESYREL) 100 MG tablet Take 200 mg by mouth at bedtime as needed. 11/10/20   [provider]  TRULICITY 1.5 MG/0.5ML SOPN Inject into the skin once a week. 01/27/21   [provider]    Allergies: Allergies  Allergen Reactions   Ramipril Anaphylaxis and Hives   Sulfa Antibiotics     Other reaction(s):  Other (See Comments)    Review of Systems: Review of Systems  Constitutional:  Negative for chills and fever.  Respiratory:  Negative for shortness of breath.   Cardiovascular:  Negative for chest pain.  Gastrointestinal:  Negative for abdominal pain, nausea and vomiting.  Musculoskeletal:  Positive for joint pain.    Physical Exam BP (!) 168/96   Pulse 76   Temp 98.3 F (36.8 C) (Oral)   Ht 5' 2 (1.575 m)   Wt 197 lb (89.4 kg)   SpO2 97%   BMI 36.03 kg/m  CONSTITUTIONAL: No acute distress HEENT:  Normocephalic, atraumatic, extraocular motion intact. RESPIRATORY:  Lungs are clear, and breath sounds are equal bilaterally. Normal respiratory effort without pathologic use of accessory muscles. CARDIOVASCULAR: Heart is regular without murmurs, gallops, or rubs. GI: The abdomen is soft, non-distended, non-tender to palpation.  The superior hernia containing transverse colon remains reducible.  The inferior hernia containing fat remains incarcerated, without any notable changes.  No evidence of strangulation.  NEUROLOGIC:  Motor and sensation is grossly normal.  Cranial nerves are grossly intact. PSYCH:  Alert and oriented to person, place and time. Affect is normal.  Labs/Imaging: CT abdomen/pelvis on 10/22/23: IMPRESSION: 1. New 13 mm right middle lobe pulmonary nodule. Suggest further evaluation with prompt dedicated chest CT. 2. Moderate-sized ventral hernia containing fat and nonobstructed portion of transverse colon. 3. Small fat containing paraumbilical hernia. 4. Large fat containing infraumbilical ventral hernia. 5. Nonobstructive bilateral renal stones. 6. Colonic diverticulosis without findings of acute diverticulitis. 7.  Aortic Atherosclerosis (ICD10-I70.0).  CT chest on 11/06/23: IMPRESSION: 1. 7 mm right apical lung nodule and 6 mm right middle lobe nodule are indeterminate. Recommend follow-up noncontrast chest CT in 4-6 months. 2. Small scattered sub 4 mm nodules  are likely benign. 3. No mediastinal or hilar mass or adenopathy. 4. Age advanced atherosclerotic calcifications including the coronary arteries. 5. 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 6. Aortic atherosclerosis.  Assessment and Plan: This is a 63 y.o. female with incarcerated ventral hernia.  --Discussed with the patient that her hernias remain stable without any new changes on exam today.  I commended her for being able to bring her HgA1c down to 6.5.  With this, it would be appropriate to proceed with surgery as her diabetes is better controlled. --With regards to her knees, she has an appointment with orthopedic surgery this week.  She does not know if she'll need surgery for this yet, or if she can still get cortisone injections, as these have helped in the past.  She reports her last injection was a year ago.   --Discussed with her that timing for her ventral hernia repair would depend on what plans are in place for her knees.  We could proceed with hernia repair or wait if she'll need knee replacement.  Discussed with her that for her hernias, the plan would still be to approach this via robotic surgery, with the use of mesh to reinforce the repair.  Discussed with her the planned incisions, risks of bleeding, infection, injury to surrounding structures, post-operative activity restriction, pain control, the possibility for outpatient surgery vs overnight stay. --If she wishes to proceed with her ventral hernia repair, would schedule her for 04/01/24 based on her preference.  If she wishes to wait, she reports that the next time her daughter is off work to help her would be in January 2026.  She will contact us  to confirm date or reschedule.  In the meantime, will send also for medical clearance to her PCP. --All of her questions have been answered.  I spent 30 minutes dedicated to the care of this patient on the date of this encounter to  include pre-visit review of records, face-to-face time with the patient discussing diagnosis and management, and any post-visit coordination of care.   Marene Shape, MD [facility]

## 2024-03-05 ENCOUNTER — Telehealth: Payer: Self-pay | Admitting: Surgery

## 2024-03-05 NOTE — Telephone Encounter (Signed)
 Called patient and she does not want to schedule surgery at this time.  She is going to have knee surgery and wants to fully heal with this prior to doing hernia repair.  Patient states that she will call us  back late fall, schedule another follow up appointment with Dr. Mauri Sous and from there will schedule surgery.

## 2024-03-10 NOTE — Progress Notes (Signed)
 Medical Clearance has been received from Dr Buren office. The patient is cleared at Low risk for surgery.

## 2024-03-13 ENCOUNTER — Encounter: Payer: Self-pay | Admitting: Family Medicine

## 2024-04-04 ENCOUNTER — Ambulatory Visit: Admission: RE | Admit: 2024-04-04 | Source: Ambulatory Visit

## 2024-04-07 ENCOUNTER — Ambulatory Visit
Admission: RE | Admit: 2024-04-07 | Discharge: 2024-04-07 | Disposition: A | Discharge status: Home / Self Care | Source: Ambulatory Visit | Attending: Surgery | Admitting: Surgery

## 2024-04-07 DIAGNOSIS — R911 Solitary pulmonary nodule: Secondary | ICD-10-CM | POA: Insufficient documentation

## 2024-04-13 ENCOUNTER — Ambulatory Visit: Payer: Self-pay | Admitting: Surgery

## 2024-04-14 ENCOUNTER — Telehealth: Payer: Self-pay | Admitting: Surgery

## 2024-04-14 ENCOUNTER — Encounter: Payer: Self-pay | Admitting: Surgery

## 2024-04-14 NOTE — Telephone Encounter (Signed)
 Patient called wanting to know results of her CT scan.  Please call.

## 2024-04-21 NOTE — Progress Notes (Unsigned)
 04/23/2024 8:47 PM   [name] [birth year] [id number]  Referring provider: Buren Rock HERO, MD [address] [place],  [place] [address]  Urological history: 1. STI -trichomonas 03/2021  2. OAB -Contributing factors of age, vaginal atrophy, diabetes and artificial sweeteners -Myrbetriq  50 mg daily  3. High risk hematuria -non-smoker -CTU (11/2021) - bilateral nephrolithiasis measuring up to 7 mm on the right -cysto (11/2021) - NED  4.  Nephrolithiasis -Bilateral nephrolithiasis seen on CT urogram (11/2021)  HPI: Rachel Valenzuela is a 63 y.o. female who presents today for 12 month follow up.    Previous records reviewed.   Contrast CT (10/2023) bilateral stones  UA ***  KUB ***  Serum creatinine (02/2024) 0.82, eGFR 80  PMH: Past Medical History:  Diagnosis Date   Asthma    Diabetes mellitus without complication (HCC)    Hyperlipidemia    Hypertension    Hypothyroid    Osteoarthritis     Surgical History: Past Surgical History:  Procedure Laterality Date   ABDOMINAL HYSTERECTOMY  2011   ABLATION      Home Medications:  Allergies as of 04/23/2024       Reactions   Ramipril Anaphylaxis, Hives   Sulfa Antibiotics    Other reaction(s): Other (See Comments)        Medication List        Accurate as of April 21, 2024  8:47 PM. If you have any questions, ask your nurse or doctor.          amLODipine 10 MG tablet Commonly known as: NORVASC Take 10 mg by mouth daily.   atenolol 100 MG tablet Commonly known as: TENORMIN Take 100 mg by mouth daily.   azelastine 0.1 % nasal spray Commonly known as: ASTELIN SMARTSIG:1-2 Puff(s) Both Nares Twice Daily   Breo Ellipta 200-25 MCG/INH Aepb Generic drug: fluticasone furoate-vilanterol Inhale 1 puff into the lungs daily.   cyclobenzaprine 5 MG tablet Commonly known as: FLEXERIL Take 5 mg by mouth 3 (three) times daily as needed for muscle spasms.   FLUoxetine 40 MG capsule Commonly  known as: PROZAC Take 40 mg by mouth daily.   hydrALAZINE 100 MG tablet Commonly known as: APRESOLINE Take 100 mg by mouth 2 (two) times daily.   ibuprofen 800 MG tablet Commonly known as: ADVIL Take by mouth as needed.   Januvia 100 MG tablet Generic drug: sitaGLIPtin Take 100 mg by mouth daily.   latanoprost 0.005 % ophthalmic solution Commonly known as: XALATAN 1 drop 2 (two) times daily.   levocetirizine 5 MG tablet Commonly known as: XYZAL Take 5 mg by mouth daily.   levothyroxine 150 MCG tablet Commonly known as: SYNTHROID Take 150 mcg by mouth daily.   meclizine 25 MG tablet Commonly known as: ANTIVERT Take by mouth 3 (three) times daily as needed.   meloxicam  15 MG tablet Commonly known as: MOBIC  Take 15 mg by mouth daily.   mirabegron  ER 50 MG Tb24 tablet Commonly known as: MYRBETRIQ  Take 1 tablet (50 mg total) by mouth daily.   montelukast 10 MG tablet Commonly known as: SINGULAIR Take 10 mg by mouth daily.   ProAir HFA 108 (90 Base) MCG/ACT inhaler Generic drug: albuterol Inhale into the lungs as needed.   Simbrinza 1-0.2 % Susp Generic drug: Brinzolamide-Brimonidine Apply 1 drop to eye 2 (two) times daily.   simvastatin 40 MG tablet Commonly known as: ZOCOR Take by mouth.   traZODone 100 MG tablet Commonly known as:  DESYREL Take 200 mg by mouth at bedtime as needed.   Trulicity 1.5 MG/0.5ML Soaj Generic drug: Dulaglutide Inject into the skin once a week.   Vitamin D 50 MCG (2000 UT) Caps Take by mouth daily.        Allergies:  Allergies  Allergen Reactions   Ramipril Anaphylaxis and Hives   Sulfa Antibiotics     Other reaction(s): Other (See Comments)    Family History: Family History  Problem Relation Age of Onset   Breast cancer Neg Hx     Social History:  reports that she has never smoked. She has never been exposed to tobacco smoke. She has never used smokeless tobacco. She reports that she does not drink alcohol and  does not use drugs.  ROS: Pertinent ROS in HPI  Physical Exam: There were no vitals taken for this visit.  Constitutional:  Well nourished. Alert and oriented, No acute distress. HEENT: [place] AT, moist mucus membranes.  Trachea midline, no masses. Cardiovascular: No clubbing, cyanosis, or edema. Respiratory: Normal respiratory effort, no increased work of breathing. GU: No CVA tenderness.  No bladder fullness or masses.  Recession of labia minora, dry, pale vulvar vaginal mucosa and loss of mucosal ridges and folds.  Normal urethral meatus, no lesions, no prolapse, no discharge.   No urethral masses, tenderness and/or tenderness. No bladder fullness, tenderness or masses. *** vagina mucosa, *** estrogen effect, no discharge, no lesions, *** pelvic support, *** cystocele and *** rectocele noted.  No cervical motion tenderness.  Uterus is freely mobile and non-fixed.  No adnexal/parametria masses or tenderness noted.  Anus and perineum are without rashes or lesions.   ***  Neurologic: Grossly intact, no focal deficits, moving all 4 extremities. Psychiatric: Normal mood and affect.    Laboratory Data: See HPI and EPIC I have reviewed the labs.  See HPI.     Pertinent Imaging: CLINICAL DATA:  Abdominal hernia follow-up protrusion at the umbilicus.   EXAM: CT ABDOMEN AND PELVIS WITH CONTRAST   TECHNIQUE: Multidetector CT imaging of the abdomen and pelvis was performed using the standard protocol following bolus administration of intravenous contrast.   RADIATION DOSE REDUCTION: This exam was performed according to the departmental dose-optimization program which includes automated exposure control, adjustment of the mA and/or kV according to patient size and/or use of iterative reconstruction technique.   CONTRAST:  OMNIPAQUE  IOHEXOL  300 MG/ML  SOLN   COMPARISON:  CT December 12, 2021   FINDINGS: Lower chest: New 13 mm right middle lobe pulmonary nodule on image 1/3.    Hepatobiliary: No suspicious hepatic lesion. Gallbladder is unremarkable. No biliary ductal dilation.   Pancreas: No pancreatic ductal dilation or evidence of acute inflammation.   Spleen: No splenomegaly.   Adrenals/Urinary Tract: Similar lobular thickening of the left adrenal gland. Right adrenal gland appears normal. No hydronephrosis. Nonobstructive 9 mm right lower pole renal stone. Additional punctate nonobstructive bilateral renal stones. Urinary bladder is unremarkable for degree of distension.   Stomach/Bowel: Small hiatal hernia. No pathologic dilation of small or large bowel. Colonic diverticulosis without findings of acute diverticulitis.   Vascular/Lymphatic: Aortic atherosclerosis. Normal caliber abdominal aorta. Smooth IVC contours. No pathologically enlarged abdominal or pelvic lymph nodes.   Reproductive: Uterine fibroid.   Other: Moderate-sized ventral hernia containing fat and nonobstructed portion of transverse colon. Small fat containing paraumbilical hernia. Large fat containing infraumbilical ventral hernia.   Musculoskeletal: Multilevel degenerative change of the spine. Sclerotic lesions in the pelvic girdle are favored bone  islands.   IMPRESSION: 1. New 13 mm right middle lobe pulmonary nodule. Suggest further evaluation with prompt dedicated chest CT. 2. Moderate-sized ventral hernia containing fat and nonobstructed portion of transverse colon. 3. Small fat containing paraumbilical hernia. 4. Large fat containing infraumbilical ventral hernia. 5. Nonobstructive bilateral renal stones. 6. Colonic diverticulosis without findings of acute diverticulitis. 7.  Aortic Atherosclerosis (ICD10-I70.0).     Electronically Signed   By: Reyes Holder M.D.   On: 10/27/2023 11:13 I have independently reviewed the films.  See HPI.    Assessment & Plan:    1. High risk hematuria -non-smoker -work up 02/2022 - nephrolithiasis -no reports of gross  heme -UA ***   2. OAB -at goal with Myrbetriq  *** -continue w/ Myrbetriq  50 mg daily ***  3. Nephrolithiasis -bilateral nephrolithiasis *** -asymptomatic at today's visit   No follow-ups on file.  These notes generated with voice recognition software. I apologize for typographical errors.  Rachel Valenzuela  [facility] [address]  [address] [place], [place] [address] [phone]

## 2024-04-23 ENCOUNTER — Ambulatory Visit (INDEPENDENT_AMBULATORY_CARE_PROVIDER_SITE_OTHER): Payer: Self-pay | Admitting: Urology

## 2024-04-23 ENCOUNTER — Encounter: Payer: Self-pay | Admitting: Urology

## 2024-04-23 ENCOUNTER — Ambulatory Visit
Admission: RE | Admit: 2024-04-23 | Discharge: 2024-04-23 | Disposition: A | Discharge status: Home / Self Care | Source: Ambulatory Visit | Attending: Urology

## 2024-04-23 ENCOUNTER — Ambulatory Visit
Admission: RE | Admit: 2024-04-23 | Discharge: 2024-04-23 | Disposition: A | Discharge status: Home / Self Care | Attending: Urology | Admitting: Urology

## 2024-04-23 ENCOUNTER — Other Ambulatory Visit: Payer: Self-pay

## 2024-04-23 VITALS — BP 204/113 | HR 92 | Ht 62.0 in | Wt 191.4 lb

## 2024-04-23 DIAGNOSIS — N200 Calculus of kidney: Secondary | ICD-10-CM | POA: Insufficient documentation

## 2024-04-23 DIAGNOSIS — N3281 Overactive bladder: Secondary | ICD-10-CM | POA: Diagnosis not present

## 2024-04-23 DIAGNOSIS — R319 Hematuria, unspecified: Secondary | ICD-10-CM

## 2024-04-23 LAB — URINALYSIS, COMPLETE
Bilirubin, UA: NEGATIVE
Glucose, UA: NEGATIVE
Ketones, UA: NEGATIVE
Nitrite, UA: NEGATIVE
RBC, UA: NEGATIVE
Specific Gravity, UA: 1.025 (ref 1.005–1.030)
Urobilinogen, Ur: 0.2 mg/dL (ref 0.2–1.0)
pH, UA: 6 (ref 5.0–7.5)

## 2024-04-23 LAB — MICROSCOPIC EXAMINATION: Epithelial Cells (non renal): >10 /HPF — AB (ref 0–10)

## 2024-04-23 MED ORDER — MIRABEGRON ER 50 MG PO TB24
50.0000 mg | ORAL_TABLET | Freq: Every day | ORAL | 11 refills | Status: AC
Start: 2024-04-23 — End: ?

## 2024-07-15 ENCOUNTER — Other Ambulatory Visit: Payer: Self-pay | Admitting: Family Medicine

## 2024-07-15 DIAGNOSIS — Z1231 Encounter for screening mammogram for malignant neoplasm of breast: Secondary | ICD-10-CM

## 2024-08-12 NOTE — Progress Notes (Signed)
    GYNECOLOGY PROGRESS NOTE  Subjective:  PCP: Buren Rock HERO, MD  Patient ID: Sherron JONETTA Hacking, female    DOB: 1961/09/18, 63 y.o.   MRN: [id number]  HPI  Patient is a 63 y.o. [id number] female who presents today as a referral from her PCP, Rock Buren, for thickened endometrium, and pt states she is due for a pap smear.  PMH of 6mm EMT in 2023, EMB done in-office, sample insufficient likely due to pt's remote endometrial ablation. At that time, pt was having rare, scant PMB and pt and Dr. Janit, in consultation with Dr. Mancil (gyn-onc), offered pt hysterectomy or to tolerate possibility of an undetected endometrial cancer. Pt declined hysterectomy and states she has not had any further bleeding since that time.    Of note, pt's BP significantly elevated today in office, PMH of HTN and did not take medication today. Endorses white coat HTN as well. Denies HA, vision changes, palpitations, dizziness/lightheadedness, or weakness and states she will go home and take her medication immediately after today's appt.   The following portions of the patient's history were reviewed and updated as appropriate: allergies, current medications, past family history, past medical history, past social history, past surgical history, and problem list.  Review of Systems Pertinent items are noted in HPI.   Objective:   Blood pressure (!) 214/101, pulse 73, weight 178 lb 4.8 oz (80.9 kg). Body mass index is 32.61 kg/m.  General appearance: alert, cooperative, and no distress Abdomen: soft, non-tender; bowel sounds normal; no masses,  no organomegaly Pelvic: cervix normal in appearance, external genitalia normal, no adnexal masses or tenderness, no cervical motion tenderness, rectovaginal septum normal, uterus normal size, shape, and consistency, and vagina normal without discharge; pap obtained. Extremities: extremities normal, atraumatic, no cyanosis or edema Neurologic: Grossly normal  Assessment/Plan:   1.  Thickened endometrium   2. Cervical cancer screening   3. Primary hypertension    63 y.o. [id number] with hx of endometrial ablation, s/p tubal ligation, and hx of fibroids, who has known thickened endometrium since approx 2023 (6mm) and without PMB for the past 71yrs, s/p in-office EMB in 2023 with Dr. Janit that was insufficient likely due to her hx of ablation. Pt has had thorough counseling with Dr. Janit who also discussed with Dr. Mancil (gynonc) and pt was offered hysterectomy, but declined. Alternative was to accept that diagnostic biopsy unable to be done. Unlikely she has an endometrial cancer as her bleeding has ceased. She reiterates desire to avoid surgery today. We reviewed that she should notify clinic if PMB returns.   HTN: due to absence of symptoms, will not send patient to ED at this time for BP of 214/101. She is completely asymptomatic. Emphasized importance of compliance with medication and damage to her cardiovascular system and risk of MI or CVA. Pt agrees to go take medication immediately after her visit here. Follow up with PCP.   Follow up 5yr for annual exam, sooner prn.  Estil Mangle, DO [facility]

## 2024-08-26 ENCOUNTER — Encounter: Payer: Self-pay | Admitting: Obstetrics

## 2024-08-26 ENCOUNTER — Other Ambulatory Visit (HOSPITAL_COMMUNITY)
Admission: RE | Admit: 2024-08-26 | Discharge: 2024-08-26 | Disposition: A | Source: Ambulatory Visit | Attending: Obstetrics | Admitting: Obstetrics

## 2024-08-26 ENCOUNTER — Ambulatory Visit: Admitting: Obstetrics

## 2024-08-26 VITALS — BP 214/101 | HR 73 | Wt 178.3 lb

## 2024-08-26 DIAGNOSIS — I10 Essential (primary) hypertension: Secondary | ICD-10-CM | POA: Insufficient documentation

## 2024-08-26 DIAGNOSIS — R9389 Abnormal findings on diagnostic imaging of other specified body structures: Secondary | ICD-10-CM | POA: Insufficient documentation

## 2024-08-26 DIAGNOSIS — Z124 Encounter for screening for malignant neoplasm of cervix: Secondary | ICD-10-CM

## 2024-08-27 ENCOUNTER — Ambulatory Visit
Admission: RE | Admit: 2024-08-27 | Discharge: 2024-08-27 | Disposition: A | Discharge status: Home / Self Care | Source: Ambulatory Visit | Attending: Family Medicine | Admitting: Family Medicine

## 2024-08-27 DIAGNOSIS — Z1231 Encounter for screening mammogram for malignant neoplasm of breast: Secondary | ICD-10-CM | POA: Diagnosis present

## 2024-08-29 LAB — CYTOLOGY - PAP
Comment: NEGATIVE
Diagnosis: NEGATIVE
High risk HPV: NEGATIVE

## 2024-09-01 ENCOUNTER — Ambulatory Visit: Payer: Self-pay | Admitting: Obstetrics

## 2024-09-03 ENCOUNTER — Encounter: Payer: Self-pay | Admitting: Surgery

## 2024-09-03 ENCOUNTER — Ambulatory Visit: Admitting: Surgery

## 2024-09-03 ENCOUNTER — Telehealth: Payer: Self-pay | Admitting: Surgery

## 2024-09-03 VITALS — Ht 62.0 in | Wt 181.0 lb

## 2024-09-03 DIAGNOSIS — K436 Other and unspecified ventral hernia with obstruction, without gangrene: Secondary | ICD-10-CM

## 2024-09-03 NOTE — Patient Instructions (Signed)
 You have requested to have a Ventral Hernia Repair. This will be done by Dr. Desiderio at [facility]. Please see your [facility]) Pre-care sheet for more information. Our surgery scheduler will call you to verify surgery date and to go over information.   If you are on any injectable weight loss medication, you will need to stop taking your GLP-1 injectable (weight loss) medications 8 days before your surgery to avoid any complications with anesthesia.   You will need to arrange to be out of work for approximately 1-2 weeks and then you may return with a lifting restriction for 4 more weeks. If you have FMLA or Disability paperwork that needs to be filled out, please have your company fax your paperwork to [phone] or you may drop this by either office. This paperwork will be filled out within 3 days after your surgery has been completed.   Ventral Hernia A ventral hernia (also called an incisional hernia) is a hernia that occurs at the site of a previous surgical cut (incision) in the abdomen. The abdominal wall spans from your lower chest down to your pelvis. If the abdominal wall is weakened from a surgical incision, a hernia can occur. A hernia is a bulge of bowel or muscle tissue pushing out on the weakened part of the abdominal wall. Ventral hernias can get bigger from straining or lifting. Obese and older people are at higher risk for a ventral hernia. People who develop infections after surgery or require repeat incisions at the same site on the abdomen are also at increased risk. CAUSES  A ventral hernia occurs because of weakness in the abdominal wall at an incision site.  SYMPTOMS  Common symptoms include: A visible bulge or lump on the abdominal wall. Pain or tenderness around the lump. Increased discomfort if you cough or make a sudden movement. If the hernia has blocked part of the intestine, a serious complication can occur (incarcerated or strangulated hernia). This can become a  problem that requires emergency surgery because the blood flow to the blocked intestine may be cut off. Symptoms may include: Feeling sick to your stomach (nauseous). Throwing up (vomiting). Stomach swelling (distention) or bloating. Fever. Rapid heartbeat. DIAGNOSIS  Your health care provider will take a medical history and perform a physical exam. Various tests may be ordered, such as: Blood tests. Urine tests. Ultrasonography. X-rays. Computed tomography (CT). TREATMENT  Watchful waiting may be all that is needed for a smaller hernia that does not cause symptoms. Your health care provider may recommend the use of a supportive belt (truss) that helps to keep the abdominal wall intact. For larger hernias or those that cause pain, surgery to repair the hernia is usually recommended. If a hernia becomes strangulated, emergency surgery needs to be done right away. HOME CARE INSTRUCTIONS Avoid putting pressure or strain on the abdominal area. Avoid heavy lifting. Use good body positioning for physical tasks. Ask your health care provider about proper body positioning. Use a supportive belt as directed by your health care provider. Maintain a healthy weight. Eat foods that are high in fiber, such as whole grains, fruits, and vegetables. Fiber helps prevent difficult bowel movements (constipation). Drink enough fluids to keep your urine clear or pale yellow. Follow up with your health care provider as directed. SEEK MEDICAL CARE IF:  Your hernia seems to be getting larger or more painful. SEEK IMMEDIATE MEDICAL CARE IF:  You have abdominal pain that is sudden and sharp. Your pain becomes severe.  You have repeated vomiting. You are sweating a lot. You notice a rapid heartbeat. You develop a fever. MAKE SURE YOU:  Understand these instructions. Will watch your condition. Will get help right away if you are not doing well or get worse.   This information is not intended to replace  advice given to you by your health care provider. Make sure you discuss any questions you have with your health care provider.   Document Released: 08/21/2012 Document Revised: 09/25/2014 Document Reviewed: 08/21/2012 Elsevier Interactive Patient Education 2016 [employer]    Laparoscopic Ventral Hernia Repair Laparoscopic ventral hernia repair is a surgery to fix a ventral hernia. A ventral hernia, also called an incisional hernia, is a bulge of body tissue or intestines that pushes through the front part of the abdomen. This can happen if the connective tissue covering the muscles over the abdomen has a weak spot or is torn because of a surgical cut (incision) from a previous surgery. Laparoscopic ventral hernia repair is often done soon after diagnosis to stop the hernia from getting bigger, becoming uncomfortable, or becoming an emergency. This surgery usually takes about 2 hours, but the time can vary greatly. LET [facility] CARE PROVIDER KNOW ABOUT: Any allergies you have. All medicines you are taking, including steroids, vitamins, herbs, eye drops, creams, and over-the-counter medicines. Previous problems you or members of your family have had with the use of anesthetics. Any blood disorders you have. Previous surgeries you have had. Medical conditions you have. RISKS AND COMPLICATIONS  Generally, laparoscopic ventral hernia repair is a safe procedure. However, as with any surgical procedure, problems can occur. Possible problems include: Bleeding. Trouble passing urine or having a bowel movement after the surgery. Infection. Pneumonia. Blood clots. Pain in the area of the hernia. A bulge in the area of the hernia that may be caused by a collection of fluid. Injury to intestines or other structures in the abdomen. Return of the hernia after surgery. In some cases, your health care provider may need to stop the laparoscopic procedure and do regular, open surgery. This may be  necessary for very difficult hernias, when organs are hard to see, or when bleeding problems occur during surgery. BEFORE THE PROCEDURE  You may need to have blood tests, urine tests, a chest X-ray, or an electrocardiogram done before the day of the surgery. Ask your health care provider about changing or stopping your regular medicines. This is especially important if you are taking diabetes medicines or blood thinners. You may need to wash with a special type of germ-killing soap. Do not eat or drink anything after midnight the night before the procedure or as directed by your health care provider. Make plans to have someone drive you home after the procedure. PROCEDURE  Small monitors will be put on your body. They are used to check your heart, blood pressure, and oxygen level. An IV access tube will be put into a vein in your hand or arm. Fluids and medicine will flow directly into your body through the IV tube. You will be given medicine that makes you go to sleep (general anesthetic). Your abdomen will be cleaned with a special soap to kill any germs on your skin. Once you are asleep, several small incisions will be made in your abdomen. The large space in your abdomen will be filled with air so that it expands. This gives your health care provider more room and a better view. A thin, lighted tube with a tiny  camera on the end (laparoscope) is put through a small incision in your abdomen. The camera on the laparoscope sends a picture to a TV screen in the operating room. This gives your health care provider a good view inside your abdomen. Hollow tubes are put through the other small incisions in your abdomen. The tools needed for the procedure are put through these tubes. Your health care provider puts the tissue or intestines that formed the hernia back in place. A screen-like patch (mesh) is used to close the hernia. This helps make the area stronger. Stitches, tacks, or staples are used to  keep the mesh in place. Medicine and a bandage (dressing) or skin glue will be put over the incisions. AFTER THE PROCEDURE  You will stay in a recovery area until the anesthetic wears off. Your blood pressure and pulse will be checked often. You may be able to go home the same day or may need to stay in the hospital for 1-2 days after surgery. Your health care provider will decide when you can go home. You may feel some pain. You may be given medicine for pain. You will be urged to do breathing exercises that involve taking deep breaths. This helps prevent a lung infection after a surgery. You may have to wear compression stockings while you are in the hospital. These stockings help keep blood clots from forming in your legs.   This information is not intended to replace advice given to you by your health care provider. Make sure you discuss any questions you have with your health care provider.   Document Released: 08/21/2012 Document Revised: 09/09/2013 Document Reviewed: 08/21/2012 Elsevier Interactive Patient Education [employer]

## 2024-09-03 NOTE — Progress Notes (Signed)
 09/03/2024  History of Present Illness: Rachel Valenzuela is a 63 y.o. female presenting for follow-up of a incarcerated ventral hernia.  The patient was last seen on 03/03/2024 at which time her hemoglobin A1c had come down to 6.5.  She had opted to postpone surgery until January 2026 when her daughter could take time off in order to be with her after surgery.  She presents today to schedule surgery.  She reports that she has been doing well and denies any worsening pain.  Reports still some tenderness occasionally but denies any worsening pain or severe pain.  She has continued to lose weight and is down to 181 pounds today although she was recently down to 178 pounds a week ago.  She has had some recent orthopedic issues but had a injection in her knee and also injection in her foot for plantar fasciitis.  Past Medical History: Past Medical History:  Diagnosis Date   Asthma    Diabetes mellitus without complication (HCC)    Hyperlipidemia    Hypertension    Hypothyroid    Osteoarthritis      Past Surgical History: Past Surgical History:  Procedure Laterality Date   ENDOMETRIAL ABLATION  2011    Home Medications: Prior to Admission medications  Medication Sig Start Date End Date Taking? Authorizing Provider  levothyroxine (SYNTHROID) 150 MCG tablet Take 150 mcg by mouth daily. Patient taking differently: Take 225 mcg by mouth daily. 02/01/21  Yes [provider]  amLODipine (NORVASC) 10 MG tablet Take 10 mg by mouth daily. 02/01/21   [provider]  atenolol (TENORMIN) 100 MG tablet Take 100 mg by mouth daily. 02/01/21   [provider]  azelastine (ASTELIN) 0.1 % nasal spray SMARTSIG:1-2 Puff(s) Both Nares Twice Daily 03/03/21   [provider]  BREO ELLIPTA 200-25 MCG/INH AEPB Inhale 1 puff into the lungs daily. 02/01/21   [provider]  Cholecalciferol (VITAMIN D) 50 MCG (2000 UT) CAPS Take by mouth daily. 11/22/20   [provider]   cyclobenzaprine (FLEXERIL) 5 MG tablet Take 5 mg by mouth 3 (three) times daily as needed for muscle spasms.    [provider]  FLUoxetine (PROZAC) 40 MG capsule Take 40 mg by mouth daily. 11/10/20   [provider]  hydrALAZINE (APRESOLINE) 100 MG tablet Take 100 mg by mouth 2 (two) times daily. 03/28/21   [provider]  ibuprofen (ADVIL) 800 MG tablet Take by mouth as needed.    [provider]  JANUVIA 100 MG tablet Take 100 mg by mouth daily. 02/01/21   [provider]  latanoprost (XALATAN) 0.005 % ophthalmic solution 1 drop 2 (two) times daily. 10/21/20   [provider]  levocetirizine (XYZAL) 5 MG tablet Take 5 mg by mouth daily. 02/01/21   [provider]  meclizine (ANTIVERT) 25 MG tablet Take by mouth 3 (three) times daily as needed.    [provider]  meloxicam  (MOBIC ) 15 MG tablet Take 15 mg by mouth daily. 02/02/21   [provider]  mirabegron  ER (MYRBETRIQ ) 50 MG TB24 tablet Take 1 tablet (50 mg total) by mouth daily. 04/23/24   Helon Kirsch A, PA-C  montelukast (SINGULAIR) 10 MG tablet Take 10 mg by mouth daily. 02/01/21   [provider]  PROAIR HFA 108 (90 Base) MCG/ACT inhaler Inhale into the lungs as needed. 03/28/21   [provider]  SIMBRINZA 1-0.2 % SUSP Apply 1 drop to eye 2 (two) times daily. 10/21/20  [provider]  simvastatin (ZOCOR) 40 MG tablet Take by mouth.    [provider]  traZODone (DESYREL) 100 MG tablet Take 200 mg by mouth at bedtime as needed. 11/10/20   [provider]  TRULICITY 1.5 MG/0.5ML SOPN Inject into the skin once a week. 01/27/21   [provider]    Allergies: Allergies[1]  Review of Systems: Review of Systems  Constitutional:  Negative for chills and fever.  Respiratory:  Negative for shortness of breath.   Cardiovascular:  Negative for chest pain.  Gastrointestinal:  Negative for abdominal pain, nausea  and vomiting.    Physical Exam Ht 5' 2 (1.575 m)   Wt 181 lb (82.1 kg)   SpO2 97%   BMI 33.11 kg/m  CONSTITUTIONAL: No acute distress HEENT:  Normocephalic, atraumatic, extraocular motion intact. RESPIRATORY:  Lungs are clear, and breath sounds are equal bilaterally. Normal respiratory effort without pathologic use of accessory muscles. CARDIOVASCULAR: Heart is regular without murmurs, gallops, or rubs. GI: The abdomen is soft, nondistended, nontender to palpation.  The patient's upper ventral hernia that contains transverse colon is reducible.  The umbilical hernia is very small.  The infraumbilical hernia containing fat is incarcerated but nontender.  NEUROLOGIC:  Motor and sensation is grossly normal.  Cranial nerves are grossly intact. PSYCH:  Alert and oriented to person, place and time. Affect is normal.  Labs/Imaging: None new.  Assessment and Plan: This is a 63 y.o. female with incarcerated ventral hernia presents  - The patient has done very well controlling her diabetes and losing weight.  Her BMI is down 5 points compared to a year ago.  She feels she is ready for surgery and I think with her diabetes better controlled and her weight being lower, we can offer her surgery now. - Discussed with patient the plan for robotic assisted incarcerated ventral hernia repair and reviewed with her the surgery at length including the planned incisions, risks of bleeding, infection, injury to surrounding structures, the use of mesh to reinforce the repair, overnight hospital stay, postoperative activity restrictions, pain control, and she is willing to proceed. - We will schedule her for surgery on 09/24/2024.  All of her questions have been answered.  I spent 30 minutes dedicated to the care of this patient on the date of this encounter to include pre-visit review of records, face-to-face time with the patient discussing diagnosis and management, and any post-visit coordination of  care.   Aloysius Sheree Plant, MD [facility]         [1]  Allergies Allergen Reactions   Ramipril Anaphylaxis and Hives   Sulfa Antibiotics     Other reaction(s): Other (See Comments)

## 2024-09-03 NOTE — H&P (View-Only) (Signed)
 09/03/2024  History of Present Illness: Rachel Valenzuela is a 63 y.o. female presenting for follow-up of a incarcerated ventral hernia.  The patient was last seen on 03/03/2024 at which time her hemoglobin A1c had come down to 6.5.  She had opted to postpone surgery until January 2026 when her daughter could take time off in order to be with her after surgery.  She presents today to schedule surgery.  She reports that she has been doing well and denies any worsening pain.  Reports still some tenderness occasionally but denies any worsening pain or severe pain.  She has continued to lose weight and is down to 181 pounds today although she was recently down to 178 pounds a week ago.  She has had some recent orthopedic issues but had a injection in her knee and also injection in her foot for plantar fasciitis.  Past Medical History: Past Medical History:  Diagnosis Date   Asthma    Diabetes mellitus without complication (HCC)    Hyperlipidemia    Hypertension    Hypothyroid    Osteoarthritis      Past Surgical History: Past Surgical History:  Procedure Laterality Date   ENDOMETRIAL ABLATION  2011    Home Medications: Prior to Admission medications  Medication Sig Start Date End Date Taking? Authorizing Provider  levothyroxine (SYNTHROID) 150 MCG tablet Take 150 mcg by mouth daily. Patient taking differently: Take 225 mcg by mouth daily. 02/01/21  Yes [provider]  amLODipine (NORVASC) 10 MG tablet Take 10 mg by mouth daily. 02/01/21   [provider]  atenolol (TENORMIN) 100 MG tablet Take 100 mg by mouth daily. 02/01/21   [provider]  azelastine (ASTELIN) 0.1 % nasal spray SMARTSIG:1-2 Puff(s) Both Nares Twice Daily 03/03/21   [provider]  BREO ELLIPTA 200-25 MCG/INH AEPB Inhale 1 puff into the lungs daily. 02/01/21   [provider]  Cholecalciferol (VITAMIN D) 50 MCG (2000 UT) CAPS Take by mouth daily. 11/22/20   [provider]   cyclobenzaprine (FLEXERIL) 5 MG tablet Take 5 mg by mouth 3 (three) times daily as needed for muscle spasms.    [provider]  FLUoxetine (PROZAC) 40 MG capsule Take 40 mg by mouth daily. 11/10/20   [provider]  hydrALAZINE (APRESOLINE) 100 MG tablet Take 100 mg by mouth 2 (two) times daily. 03/28/21   [provider]  ibuprofen (ADVIL) 800 MG tablet Take by mouth as needed.    [provider]  JANUVIA 100 MG tablet Take 100 mg by mouth daily. 02/01/21   [provider]  latanoprost (XALATAN) 0.005 % ophthalmic solution 1 drop 2 (two) times daily. 10/21/20   [provider]  levocetirizine (XYZAL) 5 MG tablet Take 5 mg by mouth daily. 02/01/21   [provider]  meclizine (ANTIVERT) 25 MG tablet Take by mouth 3 (three) times daily as needed.    [provider]  meloxicam  (MOBIC ) 15 MG tablet Take 15 mg by mouth daily. 02/02/21   [provider]  mirabegron  ER (MYRBETRIQ ) 50 MG TB24 tablet Take 1 tablet (50 mg total) by mouth daily. 04/23/24   Helon Kirsch A, PA-C  montelukast (SINGULAIR) 10 MG tablet Take 10 mg by mouth daily. 02/01/21   [provider]  PROAIR HFA 108 (90 Base) MCG/ACT inhaler Inhale into the lungs as needed. 03/28/21   [provider]  SIMBRINZA 1-0.2 % SUSP Apply 1 drop to eye 2 (two) times daily. 10/21/20  [provider]  simvastatin (ZOCOR) 40 MG tablet Take by mouth.    [provider]  traZODone (DESYREL) 100 MG tablet Take 200 mg by mouth at bedtime as needed. 11/10/20   [provider]  TRULICITY 1.5 MG/0.5ML SOPN Inject into the skin once a week. 01/27/21   [provider]    Allergies: Allergies[1]  Review of Systems: Review of Systems  Constitutional:  Negative for chills and fever.  Respiratory:  Negative for shortness of breath.   Cardiovascular:  Negative for chest pain.  Gastrointestinal:  Negative for abdominal pain, nausea  and vomiting.    Physical Exam Ht 5' 2 (1.575 m)   Wt 181 lb (82.1 kg)   SpO2 97%   BMI 33.11 kg/m  CONSTITUTIONAL: No acute distress HEENT:  Normocephalic, atraumatic, extraocular motion intact. RESPIRATORY:  Lungs are clear, and breath sounds are equal bilaterally. Normal respiratory effort without pathologic use of accessory muscles. CARDIOVASCULAR: Heart is regular without murmurs, gallops, or rubs. GI: The abdomen is soft, nondistended, nontender to palpation.  The patient's upper ventral hernia that contains transverse colon is reducible.  The umbilical hernia is very small.  The infraumbilical hernia containing fat is incarcerated but nontender.  NEUROLOGIC:  Motor and sensation is grossly normal.  Cranial nerves are grossly intact. PSYCH:  Alert and oriented to person, place and time. Affect is normal.  Labs/Imaging: None new.  Assessment and Plan: This is a 63 y.o. female with incarcerated ventral hernia presents  - The patient has done very well controlling her diabetes and losing weight.  Her BMI is down 5 points compared to a year ago.  She feels she is ready for surgery and I think with her diabetes better controlled and her weight being lower, we can offer her surgery now. - Discussed with patient the plan for robotic assisted incarcerated ventral hernia repair and reviewed with her the surgery at length including the planned incisions, risks of bleeding, infection, injury to surrounding structures, the use of mesh to reinforce the repair, overnight hospital stay, postoperative activity restrictions, pain control, and she is willing to proceed. - We will schedule her for surgery on 09/24/2024.  All of her questions have been answered.  I spent 30 minutes dedicated to the care of this patient on the date of this encounter to include pre-visit review of records, face-to-face time with the patient discussing diagnosis and management, and any post-visit coordination of  care.   Aloysius Sheree Plant, MD [facility]         [1]  Allergies Allergen Reactions   Ramipril Anaphylaxis and Hives   Sulfa Antibiotics     Other reaction(s): Other (See Comments)

## 2024-09-03 NOTE — Telephone Encounter (Signed)
 Patient has been advised of Pre-Admission date/time, and Surgery date at [facility].  Surgery Date: 09/24/24 Preadmission Testing Date: Preadmissions to call patient.   Patient informed of the scheduling process and surgery information given at time of office visit.  Patient has been made aware to call [phone], between 1-3:00pm the day before surgery, to find out what time to arrive for surgery.

## 2024-09-05 ENCOUNTER — Other Ambulatory Visit: Payer: Self-pay | Admitting: Family Medicine

## 2024-09-05 DIAGNOSIS — R928 Other abnormal and inconclusive findings on diagnostic imaging of breast: Secondary | ICD-10-CM

## 2024-09-15 ENCOUNTER — Other Ambulatory Visit: Payer: Self-pay | Admitting: Family Medicine

## 2024-09-15 ENCOUNTER — Ambulatory Visit
Admission: RE | Admit: 2024-09-15 | Discharge: 2024-09-15 | Disposition: A | Discharge status: Home / Self Care | Source: Ambulatory Visit | Attending: Family Medicine | Admitting: Family Medicine

## 2024-09-15 ENCOUNTER — Ambulatory Visit
Admission: RE | Admit: 2024-09-15 | Discharge: 2024-09-15 | Disposition: A | Source: Ambulatory Visit | Attending: Family Medicine | Admitting: Family Medicine

## 2024-09-15 DIAGNOSIS — R928 Other abnormal and inconclusive findings on diagnostic imaging of breast: Secondary | ICD-10-CM | POA: Diagnosis present

## 2024-09-16 ENCOUNTER — Other Ambulatory Visit: Payer: Self-pay

## 2024-09-16 ENCOUNTER — Inpatient Hospital Stay
Admission: RE | Admit: 2024-09-16 | Discharge: 2024-09-16 | Disposition: A | Source: Ambulatory Visit | Attending: Surgery

## 2024-09-16 VITALS — Ht 61.0 in | Wt 181.0 lb

## 2024-09-16 DIAGNOSIS — E119 Type 2 diabetes mellitus without complications: Secondary | ICD-10-CM

## 2024-09-16 DIAGNOSIS — Z01810 Encounter for preprocedural cardiovascular examination: Secondary | ICD-10-CM

## 2024-09-16 DIAGNOSIS — I10 Essential (primary) hypertension: Secondary | ICD-10-CM

## 2024-09-16 DIAGNOSIS — Z01812 Encounter for preprocedural laboratory examination: Secondary | ICD-10-CM

## 2024-09-16 HISTORY — DX: Anxiety disorder, unspecified: F41.9

## 2024-09-16 NOTE — Pre-Procedure Instructions (Signed)
 Unable to reach pt for pre op phone interview. VM left.

## 2024-09-16 NOTE — Patient Instructions (Addendum)
 Your procedure is scheduled on: Wednesday 09/24/24 To find out your arrival time, please call [phone] between 1PM - 3PM on: Tuesday 09/23/24 Report to the Registration Desk on the 1st floor of the [facility]. If your arrival time is 6:00 am, do not arrive before that time as the [facility] entrance doors do not open until 6:00 am.  REMEMBER: Instructions that are not followed completely may result in serious medical risk, up to and including death; or upon the discretion of your surgeon and anesthesiologist your surgery may need to be rescheduled.  Do not eat food after midnight the night before surgery.  No gum chewing or hard candies.  You may however, drink CLEAR liquids up to 2 hours before you are scheduled to arrive for your surgery. Do not drink anything within 2 hours of your scheduled arrival time.  Clear liquids include: - water   **Type 1 and Type 2 diabetics should only drink water.**  One week prior to surgery: Stop Anti-inflammatories (NSAIDS) such as Advil, Aleve, Ibuprofen, Motrin, Naproxen, Naprosyn and Aspirin based products such as Excedrin, Goody's Powder, BC Powder.  You may however, continue to take Tylenol if needed for pain up until the day of surgery.  Stop ALL OVER THE COUNTER supplements and vitamins for at least 7 days until after surgery.  **Follow guidelines for insulin and diabetes medications.** No Trulicity until after surgery. Last dose of Januvia will be Saturday 09/20/24.  Continue taking all of your other prescription medications up until the day of surgery.  ON THE DAY OF SURGERY ONLY TAKE THESE MEDICATIONS WITH SIPS OF WATER:  amLODipine (NORVASC) 10 MG tablet  atenolol (TENORMIN) 100 MG tablet  atorvastatin (LIPITOR) 80 MG tablet  hydrALAZINE (APRESOLINE) 100 MG tablet  levocetirizine (XYZAL) 5 MG tablet  levothyroxine (SYNTHROID) 200 MCG tablet  mirabegron  ER (MYRBETRIQ ) 50 MG TB24 tablet  montelukast (SINGULAIR) 10 MG tablet   Use  inhalers on the day of surgery and bring to the hospital.  No Alcohol for 24 hours before or after surgery.  No Smoking including e-cigarettes for 24 hours before surgery.  No chewable tobacco products for at least 6 hours before surgery.  No nicotine patches on the day of surgery.  Do not use any recreational drugs for at least a week (preferably 2 weeks) before your surgery.  Please be advised that the combination of cocaine and anesthesia may have negative outcomes, up to and including death. If you test positive for cocaine, your surgery will be cancelled.  On the morning of surgery brush your teeth with toothpaste and water, you may rinse your mouth with mouthwash if you wish. Do not swallow any toothpaste or mouthwash.  Use CHG Soap or wipes as directed on instruction sheet. (You can pick this up at our office in the [facility], the building to the left of the [employer], [address] at [address]  Do not shave body hair from the neck down 48 hours before surgery.  Do not wear lotions, powders, or perfumes.   Wear comfortable clothing (specific to your surgery type) to the hospital.  Do not wear jewelry, make-up, hairpins, clips or nail polish.  For welded (permanent) jewelry: bracelets, anklets, waist bands, etc.  Please have this removed prior to surgery.  If it is not removed, there is a chance that hospital personnel will need to cut it off on the day of surgery.  Contact lenses, hearing aids and dentures  may not be worn into surgery.  Do not bring valuables to the hospital. [facility] is not responsible for any missing/lost belongings or valuables.   Notify your doctor if there is any change in your medical condition (cold, fever, infection).  After surgery, you can help prevent lung complications by doing breathing exercises.  Take deep breaths and cough every 1-2 hours. Your doctor may order a device called an Incentive Spirometer to  help you take deep breaths.  When coughing or sneezing, hold a pillow firmly against your incision with both hands. This is called splinting. Doing this helps protect your incision. It also decreases belly discomfort.  If you are being admitted to the hospital overnight, leave your suitcase in the car. After surgery it may be brought to your room.  In case of increased patient census, it may be necessary for you, the patient, to continue your postoperative care in the Same Day Surgery department.  If you are being discharged the day of surgery, you will not be allowed to drive home. You will need a responsible individual to drive you home and stay with you for 24 hours after surgery.   Please call the Pre-admissions Testing Dept. at [phone] if you have any questions about these instructions.  Surgery Visitation Policy:  Patients having surgery or a procedure may have two visitors.  Children under the age of 33 must have an adult with them who is not the patient.  Inpatient Visitation:    Visiting hours are 7 a.m. to 8 p.m. Up to four visitors are allowed at one time in a patient room. The visitors may rotate out with other people during the day.  One visitor age 8 or older may stay with the patient overnight and must be in the room by 8 p.m.   Merchandiser, Retail to address health-related social needs:  [email or web address]                                                                                                             Preparing for Surgery with CHLORHEXIDINE GLUCONATE (CHG) Soap  Chlorhexidine Gluconate (CHG) Soap  o An antiseptic cleaner that kills germs and bonds with the skin to continue killing germs even after washing  o Used for showering the night before surgery and morning of surgery  Before surgery, you can play an important role by reducing the number of germs on your skin.  CHG (Chlorhexidine gluconate) soap is an antiseptic  cleanser which kills germs and bonds with the skin to continue killing germs even after washing.  Please do not use if you have an allergy to CHG or antibacterial soaps. If your skin becomes reddened/irritated stop using the CHG.  1. Shower the NIGHT BEFORE SURGERY with CHG soap.  2. If you choose to wash your hair, wash your hair first as usual with your normal shampoo.  3. After shampooing, rinse your hair and body thoroughly to remove the shampoo.  4. Use CHG as you would any other liquid soap. You can  apply CHG directly to the skin and wash gently with a clean washcloth.  5. Apply the CHG soap to your body only from the neck down. Do not use on open wounds or open sores. Avoid contact with your eyes, ears, mouth, and genitals (private parts). Wash face and genitals (private parts) with your normal soap.  6. Wash thoroughly, paying special attention to the area where your surgery will be performed.  7. Thoroughly rinse your body with warm water.  8. Do not shower/wash with your normal soap after using and rinsing off the CHG soap.  9. Do not use lotions, oils, etc., after showering with CHG.  10. Pat yourself dry with a clean towel.  11. Wear clean pajamas to bed the night before surgery.  12. Place clean sheets on your bed the night of your shower and do not sleep with pets.  13. Do not apply any deodorants/lotions/powders.  14. Please wear clean clothes to the hospital.  15. Remember to brush your teeth with your regular toothpaste.

## 2024-09-22 ENCOUNTER — Inpatient Hospital Stay
Admission: RE | Admit: 2024-09-22 | Discharge: 2024-09-22 | Disposition: A | Source: Ambulatory Visit | Attending: Surgery

## 2024-09-22 DIAGNOSIS — Z01810 Encounter for preprocedural cardiovascular examination: Secondary | ICD-10-CM

## 2024-09-22 DIAGNOSIS — E119 Type 2 diabetes mellitus without complications: Secondary | ICD-10-CM | POA: Insufficient documentation

## 2024-09-22 DIAGNOSIS — I4510 Unspecified right bundle-branch block: Secondary | ICD-10-CM | POA: Insufficient documentation

## 2024-09-22 DIAGNOSIS — Z01818 Encounter for other preprocedural examination: Secondary | ICD-10-CM | POA: Insufficient documentation

## 2024-09-22 DIAGNOSIS — Z01812 Encounter for preprocedural laboratory examination: Secondary | ICD-10-CM

## 2024-09-22 DIAGNOSIS — I10 Essential (primary) hypertension: Secondary | ICD-10-CM | POA: Insufficient documentation

## 2024-09-22 LAB — BASIC METABOLIC PANEL WITH GFR
Anion gap: 11 (ref 5–15)
BUN: 16 mg/dL (ref 8–23)
CO2: 27 mmol/L (ref 22–32)
Calcium: 9.5 mg/dL (ref 8.9–10.3)
Chloride: 102 mmol/L (ref 98–111)
Creatinine, Ser: 0.78 mg/dL (ref 0.44–1.00)
GFR, Estimated: >60 mL/min
Glucose, Bld: 141 mg/dL — ABNORMAL HIGH (ref 70–99)
Potassium: 3.8 mmol/L (ref 3.5–5.1)
Sodium: 140 mmol/L (ref 135–145)

## 2024-09-22 LAB — CBC
HCT: 42.7 % (ref 36.0–46.0)
Hemoglobin: 13.7 g/dL (ref 12.0–15.0)
MCH: 25.9 pg — ABNORMAL LOW (ref 26.0–34.0)
MCHC: 32.1 g/dL (ref 30.0–36.0)
MCV: 80.9 fL (ref 80.0–100.0)
Platelets: 199 K/uL (ref 150–400)
RBC: 5.28 MIL/uL — ABNORMAL HIGH (ref 3.87–5.11)
RDW: 15.8 % — ABNORMAL HIGH (ref 11.5–15.5)
WBC: 5.4 K/uL (ref 4.0–10.5)
nRBC: 0 % (ref 0.0–0.2)

## 2024-09-24 ENCOUNTER — Encounter: Payer: Self-pay | Admitting: Surgery

## 2024-09-24 ENCOUNTER — Inpatient Hospital Stay
Admission: RE | Admit: 2024-09-24 | Discharge: 2024-09-26 | DRG: 355 | Disposition: A | Discharge status: Home / Self Care | Attending: Surgery | Admitting: Surgery

## 2024-09-24 ENCOUNTER — Encounter
Admission: RE | Disposition: A | Discharge status: Home / Self Care | Payer: Self-pay | Source: Home / Self Care | Attending: Surgery

## 2024-09-24 ENCOUNTER — Encounter: Admitting: Anesthesiology

## 2024-09-24 ENCOUNTER — Other Ambulatory Visit: Payer: Self-pay

## 2024-09-24 ENCOUNTER — Inpatient Hospital Stay: Admitting: Anesthesiology

## 2024-09-24 DIAGNOSIS — Z79890 Hormone replacement therapy (postmenopausal): Secondary | ICD-10-CM

## 2024-09-24 DIAGNOSIS — E785 Hyperlipidemia, unspecified: Secondary | ICD-10-CM | POA: Diagnosis present

## 2024-09-24 DIAGNOSIS — Z882 Allergy status to sulfonamides status: Secondary | ICD-10-CM | POA: Diagnosis not present

## 2024-09-24 DIAGNOSIS — E039 Hypothyroidism, unspecified: Secondary | ICD-10-CM | POA: Diagnosis present

## 2024-09-24 DIAGNOSIS — K436 Other and unspecified ventral hernia with obstruction, without gangrene: Secondary | ICD-10-CM | POA: Diagnosis present

## 2024-09-24 DIAGNOSIS — Z01812 Encounter for preprocedural laboratory examination: Principal | ICD-10-CM

## 2024-09-24 DIAGNOSIS — Z01810 Encounter for preprocedural cardiovascular examination: Secondary | ICD-10-CM

## 2024-09-24 DIAGNOSIS — Z79899 Other long term (current) drug therapy: Secondary | ICD-10-CM | POA: Diagnosis not present

## 2024-09-24 DIAGNOSIS — I10 Essential (primary) hypertension: Secondary | ICD-10-CM | POA: Diagnosis present

## 2024-09-24 DIAGNOSIS — E119 Type 2 diabetes mellitus without complications: Secondary | ICD-10-CM | POA: Diagnosis present

## 2024-09-24 DIAGNOSIS — Z7985 Long-term (current) use of injectable non-insulin antidiabetic drugs: Secondary | ICD-10-CM

## 2024-09-24 DIAGNOSIS — Z7984 Long term (current) use of oral hypoglycemic drugs: Secondary | ICD-10-CM

## 2024-09-24 DIAGNOSIS — Z791 Long term (current) use of non-steroidal anti-inflammatories (NSAID): Secondary | ICD-10-CM

## 2024-09-24 DIAGNOSIS — Z888 Allergy status to other drugs, medicaments and biological substances status: Secondary | ICD-10-CM | POA: Diagnosis not present

## 2024-09-24 HISTORY — PX: XI ROBOTIC ASSISTED VENTRAL HERNIA: SHX6789

## 2024-09-24 LAB — GLUCOSE, CAPILLARY
Glucose-Capillary: 172 mg/dL — ABNORMAL HIGH (ref 70–99)
Glucose-Capillary: 136 mg/dL — ABNORMAL HIGH (ref 70–99)
Glucose-Capillary: 149 mg/dL — ABNORMAL HIGH (ref 70–99)
Glucose-Capillary: 183 mg/dL — ABNORMAL HIGH (ref 70–99)

## 2024-09-24 MED ORDER — CHLORHEXIDINE GLUCONATE 0.12 % MT SOLN
OROMUCOSAL | Status: AC
  Filled 2024-09-24: qty 15

## 2024-09-24 MED ORDER — SODIUM CHLORIDE (PF) 0.9 % IJ SOLN
INTRAMUSCULAR | Status: AC
  Filled 2024-09-24: qty 10

## 2024-09-24 MED ORDER — HYDRALAZINE HCL 50 MG PO TABS
100.0000 mg | ORAL_TABLET | Freq: Two times a day (BID) | ORAL | Status: DC
  Administered 2024-09-24 – 2024-09-26 (×4): 100 mg via ORAL
  Filled 2024-09-24 (×4): qty 2

## 2024-09-24 MED ORDER — ROCURONIUM BROMIDE 100 MG/10ML IV SOLN
INTRAVENOUS | Status: DC | PRN
  Administered 2024-09-24: 50 mg via INTRAVENOUS
  Administered 2024-09-24: 20 mg via INTRAVENOUS
  Administered 2024-09-24: 30 mg via INTRAVENOUS

## 2024-09-24 MED ORDER — LEVOTHYROXINE SODIUM 100 MCG PO TABS
200.0000 ug | ORAL_TABLET | Freq: Every day | ORAL | Status: DC
  Administered 2024-09-25 – 2024-09-26 (×2): 200 ug via ORAL
  Filled 2024-09-24 (×2): qty 2

## 2024-09-24 MED ORDER — ACETAMINOPHEN 500 MG PO TABS
1000.0000 mg | ORAL_TABLET | ORAL | Status: AC
  Administered 2024-09-24: 1000 mg via ORAL

## 2024-09-24 MED ORDER — PANTOPRAZOLE SODIUM 40 MG IV SOLR
40.0000 mg | Freq: Every day | INTRAVENOUS | Status: DC
  Administered 2024-09-24 – 2024-09-25 (×2): 40 mg via INTRAVENOUS
  Filled 2024-09-24 (×2): qty 10

## 2024-09-24 MED ORDER — LATANOPROST 0.005 % OP SOLN: 1.0000 [drp] | Freq: Every day | OPHTHALMIC | Status: DC

## 2024-09-24 MED ORDER — KETOROLAC TROMETHAMINE 30 MG/ML IJ SOLN
INTRAMUSCULAR | Status: DC | PRN
  Administered 2024-09-24: 30 mg via INTRAVENOUS

## 2024-09-24 MED ORDER — ROCURONIUM BROMIDE 10 MG/ML (PF) SYRINGE
PREFILLED_SYRINGE | INTRAVENOUS | Status: AC
  Filled 2024-09-24: qty 10

## 2024-09-24 MED ORDER — INSULIN ASPART 100 UNIT/ML IJ SOLN: 0.0000 [IU] | Freq: Every day | INTRAMUSCULAR | Status: DC

## 2024-09-24 MED ORDER — PHENYLEPHRINE 80 MCG/ML (10ML) SYRINGE FOR IV PUSH (FOR BLOOD PRESSURE SUPPORT)
PREFILLED_SYRINGE | INTRAVENOUS | Status: DC | PRN
  Administered 2024-09-24 (×5): 80 ug via INTRAVENOUS

## 2024-09-24 MED ORDER — LACTATED RINGERS IV SOLN: INTRAVENOUS | Status: DC | PRN

## 2024-09-24 MED ORDER — ALBUTEROL SULFATE HFA 108 (90 BASE) MCG/ACT IN AERS
INHALATION_SPRAY | RESPIRATORY_TRACT | Status: AC
  Filled 2024-09-24: qty 6.7

## 2024-09-24 MED ORDER — PROPOFOL 10 MG/ML IV BOLUS
INTRAVENOUS | Status: AC
  Filled 2024-09-24: qty 20

## 2024-09-24 MED ORDER — SODIUM CHLORIDE (PF) 0.9 % IJ SOLN
INTRAMUSCULAR | Status: AC
  Filled 2024-09-24: qty 20

## 2024-09-24 MED ORDER — ACETAMINOPHEN 10 MG/ML IV SOLN: 1000.0000 mg | Freq: Once | INTRAVENOUS | Status: DC | PRN

## 2024-09-24 MED ORDER — BUPIVACAINE LIPOSOME 1.3 % IJ SUSP
INTRAMUSCULAR | Status: AC
  Filled 2024-09-24: qty 20

## 2024-09-24 MED ORDER — ORAL CARE MOUTH RINSE: 15.0000 mL | Freq: Once | OROMUCOSAL | Status: AC

## 2024-09-24 MED ORDER — ENOXAPARIN SODIUM 40 MG/0.4ML IJ SOSY
40.0000 mg | PREFILLED_SYRINGE | INTRAMUSCULAR | Status: DC
  Administered 2024-09-25 – 2024-09-26 (×2): 40 mg via SUBCUTANEOUS
  Filled 2024-09-24 (×2): qty 0.4

## 2024-09-24 MED ORDER — TRAZODONE HCL 100 MG PO TABS: 200.0000 mg | ORAL_TABLET | Freq: Every evening | ORAL | Status: DC | PRN

## 2024-09-24 MED ORDER — OXYCODONE HCL 5 MG/5ML PO SOLN: 5.0000 mg | Freq: Once | ORAL | Status: DC | PRN

## 2024-09-24 MED ORDER — INSULIN ASPART 100 UNIT/ML IJ SOLN
0.0000 [IU] | Freq: Three times a day (TID) | INTRAMUSCULAR | Status: DC
  Administered 2024-09-25: 5 [IU] via SUBCUTANEOUS
  Administered 2024-09-25 (×2): 3 [IU] via SUBCUTANEOUS
  Administered 2024-09-26: 2 [IU] via SUBCUTANEOUS
  Filled 2024-09-24: qty 5
  Filled 2024-09-24: qty 3
  Filled 2024-09-24: qty 5
  Filled 2024-09-24: qty 2

## 2024-09-24 MED ORDER — EPHEDRINE SULFATE (PRESSORS) 25 MG/5ML IV SOSY
PREFILLED_SYRINGE | INTRAVENOUS | Status: DC | PRN
  Administered 2024-09-24: 5 mg via INTRAVENOUS
  Administered 2024-09-24: 10 mg via INTRAVENOUS

## 2024-09-24 MED ORDER — OXYCODONE HCL 5 MG PO TABS: 5.0000 mg | ORAL_TABLET | Freq: Once | ORAL | Status: DC | PRN

## 2024-09-24 MED ORDER — SUCCINYLCHOLINE CHLORIDE 200 MG/10ML IV SOSY
PREFILLED_SYRINGE | INTRAVENOUS | Status: DC | PRN
  Administered 2024-09-24: 100 mg via INTRAVENOUS

## 2024-09-24 MED ORDER — FENTANYL CITRATE (PF) 100 MCG/2ML IJ SOLN
25.0000 ug | INTRAMUSCULAR | Status: DC | PRN
  Administered 2024-09-24 (×4): 25 ug via INTRAVENOUS

## 2024-09-24 MED ORDER — FENTANYL CITRATE (PF) 100 MCG/2ML IJ SOLN
INTRAMUSCULAR | Status: AC
  Filled 2024-09-24: qty 2

## 2024-09-24 MED ORDER — FLUOXETINE HCL 20 MG PO CAPS: 40.0000 mg | ORAL_CAPSULE | Freq: Every day | ORAL | Status: DC | PRN

## 2024-09-24 MED ORDER — CHLORHEXIDINE GLUCONATE 0.12 % MT SOLN
15.0000 mL | Freq: Once | OROMUCOSAL | Status: AC
  Administered 2024-09-24: 15 mL via OROMUCOSAL

## 2024-09-24 MED ORDER — BUPIVACAINE-EPINEPHRINE (PF) 0.5% -1:200000 IJ SOLN
INTRAMUSCULAR | Status: AC
  Filled 2024-09-24: qty 30

## 2024-09-24 MED ORDER — CEFAZOLIN SODIUM-DEXTROSE 2-4 GM/100ML-% IV SOLN
2.0000 g | INTRAVENOUS | Status: AC
  Administered 2024-09-24: 2 g via INTRAVENOUS

## 2024-09-24 MED ORDER — CEFAZOLIN SODIUM-DEXTROSE 2-4 GM/100ML-% IV SOLN
INTRAVENOUS | Status: AC
  Filled 2024-09-24: qty 100

## 2024-09-24 MED ORDER — FENTANYL CITRATE (PF) 100 MCG/2ML IJ SOLN
INTRAMUSCULAR | Status: DC | PRN
  Administered 2024-09-24 (×2): 50 ug via INTRAVENOUS

## 2024-09-24 MED ORDER — DROPERIDOL 2.5 MG/ML IJ SOLN: 0.6250 mg | Freq: Once | INTRAMUSCULAR | Status: DC | PRN

## 2024-09-24 MED ORDER — LACTATED RINGERS IV SOLN: INTRAVENOUS | Status: DC

## 2024-09-24 MED ORDER — CETIRIZINE HCL 10 MG PO TABS
5.0000 mg | ORAL_TABLET | Freq: Every day | ORAL | Status: DC
  Administered 2024-09-25 – 2024-09-26 (×2): 5 mg via ORAL
  Filled 2024-09-24 (×2): qty 1

## 2024-09-24 MED ORDER — ACETAMINOPHEN 500 MG PO TABS
1000.0000 mg | ORAL_TABLET | Freq: Four times a day (QID) | ORAL | Status: DC
  Administered 2024-09-24 – 2024-09-26 (×6): 1000 mg via ORAL
  Filled 2024-09-24 (×6): qty 2

## 2024-09-24 MED ORDER — LIDOCAINE HCL (CARDIAC) PF 100 MG/5ML IV SOSY
PREFILLED_SYRINGE | INTRAVENOUS | Status: DC | PRN
  Administered 2024-09-24: 80 mg via INTRAVENOUS

## 2024-09-24 MED ORDER — SUGAMMADEX SODIUM 200 MG/2ML IV SOLN
INTRAVENOUS | Status: DC | PRN
  Administered 2024-09-24: 200 mg via INTRAVENOUS

## 2024-09-24 MED ORDER — PHENYLEPHRINE HCL-NACL 20-0.9 MG/250ML-% IV SOLN
INTRAVENOUS | Status: AC
  Filled 2024-09-24: qty 250

## 2024-09-24 MED ORDER — ATENOLOL 25 MG PO TABS
100.0000 mg | ORAL_TABLET | Freq: Every day | ORAL | Status: DC
  Administered 2024-09-25 – 2024-09-26 (×2): 100 mg via ORAL
  Filled 2024-09-24 (×2): qty 4

## 2024-09-24 MED ORDER — ONDANSETRON 4 MG PO TBDP: 4.0000 mg | ORAL_TABLET | Freq: Four times a day (QID) | ORAL | Status: DC | PRN

## 2024-09-24 MED ORDER — ONDANSETRON HCL 4 MG/2ML IJ SOLN
INTRAMUSCULAR | Status: DC | PRN
  Administered 2024-09-24: 4 mg via INTRAVENOUS

## 2024-09-24 MED ORDER — PROPOFOL 10 MG/ML IV BOLUS
INTRAVENOUS | Status: DC | PRN
  Administered 2024-09-24: 100 mg via INTRAVENOUS

## 2024-09-24 MED ORDER — 0.9 % SODIUM CHLORIDE (POUR BTL) OPTIME
TOPICAL | Status: DC | PRN
  Administered 2024-09-24: 500 mL

## 2024-09-24 MED ORDER — ALBUTEROL SULFATE (2.5 MG/3ML) 0.083% IN NEBU: 2.5000 mg | INHALATION_SOLUTION | Freq: Four times a day (QID) | RESPIRATORY_TRACT | Status: DC | PRN

## 2024-09-24 MED ORDER — PHENYLEPHRINE HCL-NACL 20-0.9 MG/250ML-% IV SOLN
INTRAVENOUS | Status: DC | PRN
  Administered 2024-09-24: 20 ug/min via INTRAVENOUS

## 2024-09-24 MED ORDER — BUPIVACAINE LIPOSOME 1.3 % IJ SUSP: 20.0000 mL | Freq: Once | INTRAMUSCULAR | Status: DC

## 2024-09-24 MED ORDER — KETOROLAC TROMETHAMINE 30 MG/ML IJ SOLN
15.0000 mg | Freq: Four times a day (QID) | INTRAMUSCULAR | Status: DC
  Administered 2024-09-25: 15 mg via INTRAVENOUS
  Filled 2024-09-24: qty 1

## 2024-09-24 MED ORDER — FLUTICASONE FUROATE-VILANTEROL 200-25 MCG/ACT IN AEPB
1.0000 | INHALATION_SPRAY | Freq: Every day | RESPIRATORY_TRACT | Status: DC
  Administered 2024-09-25: 1 via RESPIRATORY_TRACT
  Filled 2024-09-24: qty 28

## 2024-09-24 MED ORDER — ORAL CARE MOUTH RINSE: 15.0000 mL | OROMUCOSAL | Status: DC | PRN

## 2024-09-24 MED ORDER — HYDROMORPHONE HCL 1 MG/ML IJ SOLN: 0.5000 mg | INTRAMUSCULAR | Status: DC | PRN

## 2024-09-24 MED ORDER — LIDOCAINE HCL (PF) 2 % IJ SOLN
INTRAMUSCULAR | Status: AC
  Filled 2024-09-24: qty 5

## 2024-09-24 MED ORDER — OXYCODONE HCL 5 MG PO TABS
5.0000 mg | ORAL_TABLET | ORAL | Status: DC | PRN
  Administered 2024-09-24: 10 mg via ORAL
  Administered 2024-09-25: 5 mg via ORAL
  Administered 2024-09-25 (×2): 10 mg via ORAL
  Filled 2024-09-24 (×4): qty 2

## 2024-09-24 MED ORDER — BRIMONIDINE TARTRATE 0.2 % OP SOLN
1.0000 [drp] | Freq: Three times a day (TID) | OPHTHALMIC | Status: DC
  Administered 2024-09-25 – 2024-09-26 (×4): 1 [drp] via OPHTHALMIC
  Filled 2024-09-24: qty 5

## 2024-09-24 MED ORDER — POLYETHYLENE GLYCOL 3350 17 G PO PACK: 17.0000 g | PACK | Freq: Every day | ORAL | Status: DC | PRN

## 2024-09-24 MED ORDER — FENTANYL CITRATE (PF) 100 MCG/2ML IJ SOLN: 25.0000 ug | INTRAMUSCULAR | Status: DC | PRN

## 2024-09-24 MED ORDER — SODIUM CHLORIDE 0.9 % IV SOLN: INTRAVENOUS | Status: DC

## 2024-09-24 MED ORDER — MIDAZOLAM HCL (PF) 2 MG/2ML IJ SOLN
INTRAMUSCULAR | Status: DC | PRN
  Administered 2024-09-24: 2 mg via INTRAVENOUS

## 2024-09-24 MED ORDER — MIRABEGRON ER 50 MG PO TB24
50.0000 mg | ORAL_TABLET | Freq: Every day | ORAL | Status: DC
  Administered 2024-09-25 – 2024-09-26 (×2): 50 mg via ORAL
  Filled 2024-09-24 (×2): qty 1

## 2024-09-24 MED ORDER — MONTELUKAST SODIUM 10 MG PO TABS
10.0000 mg | ORAL_TABLET | Freq: Every day | ORAL | Status: DC
  Administered 2024-09-25: 10 mg via ORAL
  Filled 2024-09-24: qty 1

## 2024-09-24 MED ORDER — CHLORHEXIDINE GLUCONATE CLOTH 2 % EX PADS
6.0000 | MEDICATED_PAD | Freq: Once | CUTANEOUS | Status: AC
  Administered 2024-09-24: 6 via TOPICAL

## 2024-09-24 MED ORDER — GABAPENTIN 300 MG PO CAPS
ORAL_CAPSULE | ORAL | Status: AC
  Filled 2024-09-24: qty 1

## 2024-09-24 MED ORDER — ACETAMINOPHEN 500 MG PO TABS
ORAL_TABLET | ORAL | Status: AC
  Filled 2024-09-24: qty 2

## 2024-09-24 MED ORDER — KETOROLAC TROMETHAMINE 30 MG/ML IJ SOLN
INTRAMUSCULAR | Status: AC
  Filled 2024-09-24: qty 1

## 2024-09-24 MED ORDER — AMLODIPINE BESYLATE 10 MG PO TABS
10.0000 mg | ORAL_TABLET | Freq: Every day | ORAL | Status: DC
  Administered 2024-09-25 – 2024-09-26 (×2): 10 mg via ORAL
  Filled 2024-09-24 (×2): qty 1

## 2024-09-24 MED ORDER — ONDANSETRON HCL 4 MG/2ML IJ SOLN: 4.0000 mg | Freq: Four times a day (QID) | INTRAMUSCULAR | Status: DC | PRN

## 2024-09-24 MED ORDER — BRINZOLAMIDE 1 % OP SUSP
1.0000 [drp] | Freq: Three times a day (TID) | OPHTHALMIC | Status: DC
  Administered 2024-09-25 – 2024-09-26 (×4): 1 [drp] via OPHTHALMIC
  Filled 2024-09-24: qty 10

## 2024-09-24 MED ORDER — FENTANYL CITRATE (PF) 50 MCG/ML IJ SOSY: 25.0000 ug | PREFILLED_SYRINGE | INTRAMUSCULAR | Status: DC | PRN

## 2024-09-24 MED ORDER — SUCCINYLCHOLINE CHLORIDE 200 MG/10ML IV SOSY
PREFILLED_SYRINGE | INTRAVENOUS | Status: AC
  Filled 2024-09-24: qty 10

## 2024-09-24 MED ORDER — MECLIZINE HCL 25 MG PO TABS: 25.0000 mg | ORAL_TABLET | Freq: Three times a day (TID) | ORAL | Status: DC | PRN

## 2024-09-24 MED ORDER — GABAPENTIN 300 MG PO CAPS
300.0000 mg | ORAL_CAPSULE | ORAL | Status: AC
  Administered 2024-09-24: 300 mg via ORAL

## 2024-09-24 MED ORDER — MIDAZOLAM HCL 2 MG/2ML IJ SOLN
INTRAMUSCULAR | Status: AC
  Filled 2024-09-24: qty 2

## 2024-09-24 MED ORDER — SODIUM CHLORIDE (PF) 0.9 % IJ SOLN
INTRAMUSCULAR | Status: DC | PRN
  Administered 2024-09-24: 80 mL via INTRAMUSCULAR

## 2024-09-24 NOTE — Interval H&P Note (Signed)
 History and Physical Interval Note:  09/24/2024 12:13 PM  Rachel Valenzuela  has presented today for surgery, with the diagnosis of incarcerated ventral hernia.  The various methods of treatment have been discussed with the patient and family. After consideration of risks, benefits and other options for treatment, the patient has consented to  Procedures: REPAIR, HERNIA, VENTRAL, ROBOT-ASSISTED (N/A) as a surgical intervention.  The patient's history has been reviewed, patient examined, no change in status, stable for surgery.  I have reviewed the patient's chart and labs.  Questions were answered to the patient's satisfaction.     [name]

## 2024-09-24 NOTE — Anesthesia Preprocedure Evaluation (Signed)
"                                    Anesthesia Evaluation  Patient identified by MRN, date of birth, ID band Patient awake    Reviewed: Allergy & Precautions, H&P , NPO status , Patient's Chart, lab work & pertinent test results, reviewed documented beta blocker date and time   Airway Mallampati: II  TM Distance: >3 FB Neck ROM: full    Dental  (+) Teeth Intact   Pulmonary neg shortness of breath, asthma , Patient did not abstain from smoking.   Pulmonary exam normal        Cardiovascular Exercise Tolerance: Poor hypertension, On Medications negative cardio ROS Normal cardiovascular exam Rhythm:regular Rate:Normal     Neuro/Psych   Anxiety     negative neurological ROS  negative psych ROS   GI/Hepatic negative GI ROS, Neg liver ROS,,,  Endo/Other  diabetes, Well ControlledHypothyroidism    Renal/GU negative Renal ROS  negative genitourinary   Musculoskeletal   Abdominal   Peds  Hematology negative hematology ROS (+)   Anesthesia Other Findings Past Medical History: No date: Anxiety No date: Asthma No date: Diabetes mellitus without complication (HCC) No date: Hyperlipidemia No date: Hypertension No date: Hypothyroid No date: Osteoarthritis Past Surgical History: 2011: ENDOMETRIAL ABLATION No date: TUBAL LIGATION BMI    Body Mass Index: 33.44 kg/m     Reproductive/Obstetrics negative OB ROS                              Anesthesia Physical Anesthesia Plan  ASA: 3  Anesthesia Plan: General ETT   Post-op Pain Management:    Induction:   PONV Risk Score and Plan: 4 or greater  Airway Management Planned:   Additional Equipment:   Intra-op Plan:   Post-operative Plan:   Informed Consent: I have reviewed the patients History and Physical, chart, labs and discussed the procedure including the risks, benefits and alternatives for the proposed anesthesia with the patient or authorized representative who has  indicated his/her understanding and acceptance.     Dental Advisory Given  Plan Discussed with: CRNA  Anesthesia Plan Comments:         Anesthesia Quick Evaluation  "

## 2024-09-24 NOTE — Plan of Care (Signed)
" °  Problem: Education: Goal: Knowledge of the prescribed therapeutic regimen will improve Outcome: Progressing   Problem: Cardiac: Goal: Ability to maintain an adequate cardiac output Outcome: Progressing   Problem: Nutritional: Goal: Will attain and maintain optimal nutritional status Outcome: Progressing   Problem: Clinical Measurements: Goal: Ability to maintain clinical measurements within normal limits Outcome: Progressing Goal: Postoperative complications will be avoided or minimized Outcome: Progressing   Problem: Respiratory: Goal: Will regain and/or maintain adequate ventilation Outcome: Progressing   "

## 2024-09-24 NOTE — Transfer of Care (Signed)
 Immediate Anesthesia Transfer of Care Note  Patient: Rachel Valenzuela  Procedure(s) Performed: REPAIR, HERNIA, VENTRAL, ROBOT-ASSISTED  Patient Location: PACU  Anesthesia Type:General  Level of Consciousness: drowsy and patient cooperative  Airway & Oxygen Therapy: Patient Spontanous Breathing and Patient connected to face mask oxygen  Post-op Assessment: Report given to RN and Post -op Vital signs reviewed and stable  Post vital signs: Reviewed and stable  Last Vitals:  Vitals Value Taken Time  BP 128/71 09/24/24 16:45  Temp 36.5 C 09/24/24 16:45  Pulse 75 09/24/24 16:48  Resp 23 09/24/24 16:48  SpO2 100 % 09/24/24 16:48  Vitals shown include unfiled device data.  Last Pain:  Vitals:   09/24/24 1147  TempSrc:   PainSc: 0-No pain         Complications: No notable events documented.

## 2024-09-24 NOTE — Anesthesia Postprocedure Evaluation (Signed)
"   Anesthesia Post Note  Patient: Rachel Valenzuela  Procedure(s) Performed: REPAIR, HERNIA, VENTRAL, ROBOT-ASSISTED  Patient location during evaluation: PACU Anesthesia Type: General Level of consciousness: awake and alert Pain management: pain level controlled Vital Signs Assessment: post-procedure vital signs reviewed and stable Respiratory status: spontaneous breathing, nonlabored ventilation, respiratory function stable and patient connected to nasal cannula oxygen Cardiovascular status: blood pressure returned to baseline and stable Postop Assessment: no apparent nausea or vomiting Anesthetic complications: no   No notable events documented.   Last Vitals:  Vitals:   09/24/24 1111 09/24/24 1645  BP: 131/68 128/71  Pulse: 80 73  Resp: 18 19  Temp: 37.2 C 36.5 C  SpO2: 97% 100%    Last Pain:  Vitals:   09/24/24 1659  TempSrc:   PainSc: 8                  Lynwood KANDICE Clause      "

## 2024-09-24 NOTE — Op Note (Signed)
 " Procedure Date:  09/24/2024  Pre-operative Diagnosis:  Incarcerated ventral hernias  Post-operative Diagnosis: Incarcerated ventral hernias, total defect length 11 cm.  Procedure:  Robotic assisted Incarcerated ventral Hernia Repair with mesh  Surgeon:  Aloysius Sheree Plant, MD  Anesthesia:  General endotracheal  Estimated Blood Loss:  15 ml  Specimens:  None  Complications:  None  Indications for Procedure:  This is a 63 y.o. female who presents with an incarcerated ventral hernia.  The options of surgery versus observation were reviewed with the patient and/or family. The risks of bleeding, abscess or infection, recurrence of symptoms, potential for an open procedure, injury to surrounding structures, and chronic pain were all discussed with the patient and was willing to proceed.  Description of Procedure: The patient was correctly identified in the preoperative area and brought into the operating room.  The patient was placed supine with VTE prophylaxis in place.  Appropriate time-outs were performed.  Anesthesia was induced and the patient was intubated.  Appropriate antibiotics were infused.  The abdomen was prepped and draped in a sterile fashion. The patient's hernia defect was marked with a marking pen.  A Veress needle was introduced in the left upper quadrant and pneumoperitoneum was obtained with appropriate pressures.  Using Optiview technique, an 8 mm port was introduced in the left lateral abdominal wall without complications.  Initially the Veress needle could not be visualized.  Insufflation was changed to the 8 mm port and then we were able to see that the needle had gone through the epiploic fat of descending colon.  The needle was removed.  Then, a 12 mm port was introduced in the left upper quadrant and an 8 mm port in the left lower quadrant under direct visualization.  The DaVinci platform was docked, camera targeted, and instruments placed under direct  visualization.  The descending colon was evaluated and no bowel injury was noted.  There was a small area where the Veress needle could have scratched the serosa of the bowel, an area measuring 2 mm.  This was reinforced as a precaution with a 3-0 Silk suture.  The patient's hernias were fully reduced and the peritoneum and preperitoneal fat were dissected to allow better exposure of the hernia defect and for better mesh placement.  The superior hernia contained a portion of transverse colon with its mesentery and a portion of omentum.  The umbilical portion was small and contained preperitoneal fat.  The infraumbilical portion contained omentum which was the truly incarcerated portion.  The hernia defects were measured to be 11 cm in length.  Two 4 x 6 inch Bard Ventralight ST Echo mesh, two 0 Stratafix suture, and five 2-0 Stratafix sutures were inserted through the 12 mm port under direct visualization. The hernia defects were closed using the 0 Stratafix sutures.  Two PMIs were brought through two different portions of the total hernia defects and the positioning system of the each mesh was passed through the top and bottom PMI.  This allowed the two mesh pieces to splay open with very good overlap between each other and with the hernia defects.  The mesh were then sutured in place circumferentially and through the center of the mesh using the 2-0 Stratafix sutures.  All needles and the positioning systems were then removed through the 12 mm port without complications.  The DaVinci platform was then undocked and instruments removed.  The descending colon area was again visualized and there was no evidence of any bowel injury.  80 ml of Exparel  solution mixed with 0.5% bupivacaine  with epi and saline was infiltrated around the mesh edges, hernia repair site, and port sites.  The 12 mm port was removed and the fascia was closed under direct visualization utilizing an Endo Close technique with 0 Vicryl suture.   The 8 mm ports were removed. The 12 mm incision was closed using 3-0 Vicryl and 4-0 Monocryl, and the other port incisions were closed with 4-0 Monocryl.  The wounds were cleaned and sealed with DermaBond.  The patient was emerged from anesthesia and extubated and brought to the recovery room for further management.  The patient tolerated the procedure well and all counts were correct at the end of the case.   Aloysius Sheree Plant, MD  "

## 2024-09-24 NOTE — Anesthesia Procedure Notes (Signed)
 Procedure Name: Intubation Date/Time: 09/24/2024 1:21 PM  Performed by: Lacretia Camelia NOVAK, CRNAPre-anesthesia Checklist: Patient identified, Emergency Drugs available, Suction available and Patient being monitored Patient Re-evaluated:Patient Re-evaluated prior to induction Oxygen Delivery Method: Circle system utilized Preoxygenation: Pre-oxygenation with 100% oxygen Induction Type: IV induction and Rapid sequence Laryngoscope Size: McGrath and 3 Grade View: Grade I Tube type: Oral Tube size: 6.5 mm Number of attempts: 1 Airway Equipment and Method: Stylet and Video-laryngoscopy Placement Confirmation: ETT inserted through vocal cords under direct vision, positive ETCO2 and breath sounds checked- equal and bilateral Secured at: 21 cm Tube secured with: Tape Dental Injury: Teeth and Oropharynx as per pre-operative assessment

## 2024-09-25 ENCOUNTER — Encounter: Payer: Self-pay | Admitting: Surgery

## 2024-09-25 LAB — BASIC METABOLIC PANEL WITH GFR
Anion gap: 12 (ref 5–15)
BUN: 26 mg/dL — ABNORMAL HIGH (ref 8–23)
CO2: 19 mmol/L — ABNORMAL LOW (ref 22–32)
Calcium: 8.9 mg/dL (ref 8.9–10.3)
Chloride: 104 mmol/L (ref 98–111)
Creatinine, Ser: 1.2 mg/dL — ABNORMAL HIGH (ref 0.44–1.00)
GFR, Estimated: 51 mL/min — ABNORMAL LOW
Glucose, Bld: 139 mg/dL — ABNORMAL HIGH (ref 70–99)
Potassium: 3.6 mmol/L (ref 3.5–5.1)
Sodium: 135 mmol/L (ref 135–145)

## 2024-09-25 LAB — GLUCOSE, CAPILLARY
Glucose-Capillary: 154 mg/dL — ABNORMAL HIGH (ref 70–99)
Glucose-Capillary: 213 mg/dL — ABNORMAL HIGH (ref 70–99)
Glucose-Capillary: 156 mg/dL — ABNORMAL HIGH (ref 70–99)
Glucose-Capillary: 158 mg/dL — ABNORMAL HIGH (ref 70–99)

## 2024-09-25 LAB — CBC
HCT: 36.5 % (ref 36.0–46.0)
Hemoglobin: 11.7 g/dL — ABNORMAL LOW (ref 12.0–15.0)
MCH: 26 pg (ref 26.0–34.0)
MCHC: 32.1 g/dL (ref 30.0–36.0)
MCV: 81.1 fL (ref 80.0–100.0)
Platelets: 243 K/uL (ref 150–400)
RBC: 4.5 MIL/uL (ref 3.87–5.11)
RDW: 16.5 % — ABNORMAL HIGH (ref 11.5–15.5)
WBC: 13.2 K/uL — ABNORMAL HIGH (ref 4.0–10.5)
nRBC: 0 % (ref 0.0–0.2)

## 2024-09-25 LAB — HIV ANTIBODY (ROUTINE TESTING W REFLEX): HIV Screen 4th Generation wRfx: NONREACTIVE

## 2024-09-25 LAB — MAGNESIUM: Magnesium: 1.9 mg/dL (ref 1.7–2.4)

## 2024-09-25 MED ORDER — SODIUM CHLORIDE 0.9 % IV SOLN: INTRAVENOUS | Status: AC

## 2024-09-25 MED ORDER — POTASSIUM CHLORIDE CRYS ER 20 MEQ PO TBCR
40.0000 meq | EXTENDED_RELEASE_TABLET | Freq: Once | ORAL | Status: AC
  Administered 2024-09-25: 40 meq via ORAL
  Filled 2024-09-25: qty 2

## 2024-09-25 NOTE — TOC CM/SW Note (Signed)
 Transition of Care [facility]) CM/SW Note    Transition of Care [facility]) - Inpatient Brief Assessment   Patient Details  Name: Rachel Valenzuela MRN: [id number] Date of Birth: 1961/06/20  [facility]) CM/SW Contact:    Alfonso Rummer, LCSW Phone Number: 09/25/2024, 10:59 AM   Clinical Narrative: Completed toc chart review. No TOC needs identified please contact toc should needs arise.    Transition of Care Asessment: Insurance and Status: Insurance coverage has been reviewed Patient has primary care physician: Yes (MILES, LINDA M) Home environment has been reviewed: single family home   Prior/Current Home Services: No current home services Social Drivers of Health Review: SDOH reviewed no interventions necessary Readmission risk has been reviewed: No Transition of care needs: no transition of care needs at this time

## 2024-09-25 NOTE — Progress Notes (Signed)
 [facility] SURGICAL PROGRESS NOTE  Hospital Day(s): 1.   Post op day(s): 1 Day Post-Op.   Interval History:  Patient seen and examined No acute events or new complaints overnight.  Patient reports she is quite sore this morning, anxious about going home No fever, chills, nausea, emesis  Leukocytosis to 13.2K - likely reactive Hgb to 11.7 Mild AKI; sCr - 1.21; UO - unmeasured x2 No electrolyte derangements  CLD; no issues  She does report flatus this AM  Vital signs in last 24 hours: [min-max] current  Temp:  [97 F (36.1 C)-98.9 F (37.2 C)] 98.6 F (37 C) (01/08 0500) Pulse Rate:  [65-80] 78 (01/08 0500) Resp:  [13-28] 18 (01/08 0500) BP: (104-131)/(51-71) 120/69 (01/08 0500) SpO2:  [95 %-100 %] 95 % (01/08 0500) Weight:  [80.3 kg] 80.3 kg (01/07 1147)     Height: 5' 1 (154.9 cm) Weight: 80.3 kg BMI (Calculated): 33.46   Intake/Output last 2 shifts:  01/07 0701 - 01/08 0700 In: 2730.8 [P.O.:600; I.V.:2030.8; IV Piggyback:100] Out: 15 [Blood:15]   Physical Exam:  Constitutional: alert, cooperative and no distress  Respiratory: breathing non-labored at rest  Cardiovascular: regular rate and sinus rhythm  Gastrointestinal: soft, incisional soreness, and non-distended, no rebound/guarding. Abdominal binder in place  Integumentary: Laparoscopic incisions are CDI with dermabond   Labs:     Latest Ref Rng & Units 09/25/2024    4:34 AM 09/22/2024    8:19 AM  CBC  WBC 4.0 - 10.5 K/uL 13.2  5.4   Hemoglobin 12.0 - 15.0 g/dL 88.2  86.2   Hematocrit 36.0 - 46.0 % 36.5  42.7   Platelets 150 - 400 K/uL 243  199       Latest Ref Rng & Units 09/25/2024    4:34 AM 09/22/2024    8:19 AM 10/22/2023   11:33 AM  CMP  Glucose 70 - 99 mg/dL 860  858    BUN 8 - 23 mg/dL 26  16    Creatinine 9.55 - 1.00 mg/dL 8.79  9.21  9.19   Sodium 135 - 145 mmol/L 135  140    Potassium 3.5 - 5.1 mmol/L 3.6  3.8    Chloride 98 - 111 mmol/L 104  102    CO2 22 - 32 mmol/L 19  27     Calcium 8.9 - 10.3 mg/dL 8.9  9.5       Imaging studies: No new pertinent imaging studies   Assessment/Plan: 64 y.o. female 1 Day Post-Op s/p robotic assisted laparoscopic ventral hernia repair    - Will advance to FLD this AM. If she does well, okay for soft diet for lunch/dinner  - Continue IVF given AKI - morning labs   - Monitor abdominal examination; on-going bowel function    - Continue abdominal binder  - Pain control prn; antiemetics prn   - Mobilize  - Discharge Planning: We will keep another day given patient concerns for lack of help at home, AKI. Will anticipate DC home tomorrow morning.   All of the above findings and recommendations were discussed with the patient, and the medical team, and all of patient's questions were answered to her expressed satisfaction.  -- Arthea Platt, PA-C [facility] 09/25/2024, 7:37 AM M-F: 7am - 4pm

## 2024-09-25 NOTE — Plan of Care (Signed)
" °  Problem: Education: Goal: Knowledge of the prescribed therapeutic regimen will improve Outcome: Progressing   Problem: Bowel/Gastric: Goal: Gastrointestinal status for postoperative course will improve Outcome: Progressing   Problem: Cardiac: Goal: Ability to maintain an adequate cardiac output Outcome: Progressing Goal: Will show no evidence of cardiac arrhythmias Outcome: Progressing   Problem: Nutritional: Goal: Will attain and maintain optimal nutritional status Outcome: Progressing   Problem: Clinical Measurements: Goal: Ability to maintain clinical measurements within normal limits Outcome: Progressing Goal: Postoperative complications will be avoided or minimized Outcome: Progressing   "

## 2024-09-26 ENCOUNTER — Other Ambulatory Visit: Payer: Self-pay

## 2024-09-26 LAB — CBC
HCT: 34.8 % — ABNORMAL LOW (ref 36.0–46.0)
Hemoglobin: 11 g/dL — ABNORMAL LOW (ref 12.0–15.0)
MCH: 25.9 pg — ABNORMAL LOW (ref 26.0–34.0)
MCHC: 31.6 g/dL (ref 30.0–36.0)
MCV: 82.1 fL (ref 80.0–100.0)
Platelets: 201 K/uL (ref 150–400)
RBC: 4.24 MIL/uL (ref 3.87–5.11)
RDW: 16.5 % — ABNORMAL HIGH (ref 11.5–15.5)
WBC: 11.2 K/uL — ABNORMAL HIGH (ref 4.0–10.5)
nRBC: 0 % (ref 0.0–0.2)

## 2024-09-26 LAB — GLUCOSE, CAPILLARY
Glucose-Capillary: 133 mg/dL — ABNORMAL HIGH (ref 70–99)
Glucose-Capillary: 187 mg/dL — ABNORMAL HIGH (ref 70–99)

## 2024-09-26 LAB — BASIC METABOLIC PANEL WITH GFR
Anion gap: 12 (ref 5–15)
BUN: 28 mg/dL — ABNORMAL HIGH (ref 8–23)
CO2: 16 mmol/L — ABNORMAL LOW (ref 22–32)
Calcium: 8.7 mg/dL — ABNORMAL LOW (ref 8.9–10.3)
Chloride: 106 mmol/L (ref 98–111)
Creatinine, Ser: 1.23 mg/dL — ABNORMAL HIGH (ref 0.44–1.00)
GFR, Estimated: 49 mL/min — ABNORMAL LOW
Glucose, Bld: 113 mg/dL — ABNORMAL HIGH (ref 70–99)
Potassium: 4.1 mmol/L (ref 3.5–5.1)
Sodium: 134 mmol/L — ABNORMAL LOW (ref 135–145)

## 2024-09-26 MED ORDER — OXYCODONE HCL 5 MG PO TABS
5.0000 mg | ORAL_TABLET | ORAL | 0 refills | Status: DC | PRN
  Filled 2024-09-26: qty 30, 5d supply, fill #0

## 2024-09-26 NOTE — Plan of Care (Signed)
 " Problem: Education: Goal: Knowledge of the prescribed therapeutic regimen will improve Outcome: Adequate for Discharge   Problem: Bowel/Gastric: Goal: Gastrointestinal status for postoperative course will improve Outcome: Adequate for Discharge   Problem: Cardiac: Goal: Ability to maintain an adequate cardiac output Outcome: Adequate for Discharge Goal: Will show no evidence of cardiac arrhythmias Outcome: Adequate for Discharge   Problem: Nutritional: Goal: Will attain and maintain optimal nutritional status Outcome: Adequate for Discharge   Problem: Neurological: Goal: Will regain or maintain usual level of consciousness Outcome: Adequate for Discharge   Problem: Clinical Measurements: Goal: Ability to maintain clinical measurements within normal limits Outcome: Adequate for Discharge Goal: Postoperative complications will be avoided or minimized Outcome: Adequate for Discharge   Problem: Respiratory: Goal: Will regain and/or maintain adequate ventilation Outcome: Adequate for Discharge Goal: Respiratory status will improve Outcome: Adequate for Discharge   Problem: Skin Integrity: Goal: Demonstrates signs of wound healing without infection Outcome: Adequate for Discharge   Problem: Urinary Elimination: Goal: Will remain Anureet Bruington from infection Outcome: Adequate for Discharge Goal: Ability to achieve and maintain adequate urine output Outcome: Adequate for Discharge   Problem: Education: Goal: Knowledge of General Education information will improve Description: Including pain rating scale, medication(s)/side effects and non-pharmacologic comfort measures Outcome: Adequate for Discharge   Problem: Health Behavior/Discharge Planning: Goal: Ability to manage health-related needs will improve Outcome: Adequate for Discharge   Problem: Clinical Measurements: Goal: Ability to maintain clinical measurements within normal limits will improve Outcome: Adequate for  Discharge Goal: Will remain Keidra Withers from infection Outcome: Adequate for Discharge Goal: Diagnostic test results will improve Outcome: Adequate for Discharge Goal: Respiratory complications will improve Outcome: Adequate for Discharge Goal: Cardiovascular complication will be avoided Outcome: Adequate for Discharge   Problem: Activity: Goal: Risk for activity intolerance will decrease Outcome: Adequate for Discharge   Problem: Nutrition: Goal: Adequate nutrition will be maintained Outcome: Adequate for Discharge   Problem: Coping: Goal: Level of anxiety will decrease Outcome: Adequate for Discharge   Problem: Elimination: Goal: Will not experience complications related to bowel motility Outcome: Adequate for Discharge Goal: Will not experience complications related to urinary retention Outcome: Adequate for Discharge   Problem: Pain Managment: Goal: General experience of comfort will improve and/or be controlled Outcome: Adequate for Discharge   Problem: Safety: Goal: Ability to remain Colbert Curenton from injury will improve Outcome: Adequate for Discharge   Problem: Skin Integrity: Goal: Risk for impaired skin integrity will decrease Outcome: Adequate for Discharge   Problem: Education: Goal: Ability to describe self-care measures that may prevent or decrease complications (Diabetes Survival Skills Education) will improve Outcome: Adequate for Discharge Goal: Individualized Educational Video(s) Outcome: Adequate for Discharge   Problem: Coping: Goal: Ability to adjust to condition or change in health will improve Outcome: Adequate for Discharge   Problem: Fluid Volume: Goal: Ability to maintain a balanced intake and output will improve Outcome: Adequate for Discharge   Problem: Health Behavior/Discharge Planning: Goal: Ability to identify and utilize available resources and services will improve Outcome: Adequate for Discharge Goal: Ability to manage health-related needs  will improve Outcome: Adequate for Discharge   Problem: Metabolic: Goal: Ability to maintain appropriate glucose levels will improve Outcome: Adequate for Discharge   Problem: Nutritional: Goal: Maintenance of adequate nutrition will improve Outcome: Adequate for Discharge Goal: Progress toward achieving an optimal weight will improve Outcome: Adequate for Discharge   Problem: Skin Integrity: Goal: Risk for impaired skin integrity will decrease Outcome: Adequate for Discharge   Problem: Tissue Perfusion: Goal:  Adequacy of tissue perfusion will improve Outcome: Adequate for Discharge   "

## 2024-09-26 NOTE — Plan of Care (Signed)
" °  Problem: Education: Goal: Knowledge of the prescribed therapeutic regimen will improve Outcome: Progressing   Problem: Bowel/Gastric: Goal: Gastrointestinal status for postoperative course will improve Outcome: Progressing   Problem: Cardiac: Goal: Ability to maintain an adequate cardiac output Outcome: Progressing Goal: Will show no evidence of cardiac arrhythmias Outcome: Progressing   Problem: Nutritional: Goal: Will attain and maintain optimal nutritional status Outcome: Progressing   Problem: Neurological: Goal: Will regain or maintain usual level of consciousness Outcome: Progressing   Problem: Clinical Measurements: Goal: Ability to maintain clinical measurements within normal limits Outcome: Progressing Goal: Postoperative complications will be avoided or minimized Outcome: Progressing   Problem: Respiratory: Goal: Will regain and/or maintain adequate ventilation Outcome: Progressing Goal: Respiratory status will improve Outcome: Progressing   Problem: Skin Integrity: Goal: Demonstrates signs of wound healing without infection Outcome: Progressing   "

## 2024-09-26 NOTE — Discharge Summary (Addendum)
 [facility] SURGICAL DISCHARGE SUMMARY  Patient ID: Rachel Valenzuela MRN: [id number] DOB/AGE: 11/28/1960 64 y.o.  Admit date: 09/24/2024 Discharge date: 09/26/2024  Discharge Diagnoses Patient Active Problem List   Diagnosis Date Noted   Incarcerated ventral hernia 09/24/2024    Consultants None  Procedures 09/24/2024:  Robotic assisted laparoscopic ventral hernia repair   HPI: Rachel Valenzuela is a 64 y.o. female who presents with an incarcerated ventral hernia.   Hospital Course: Informed consent was obtained and documented, and patient underwent uneventful robotic assisted laparoscopic ventral hernia repair (Dr Rachel, 09/24/2024).  Post-operatively, patient did stay POD1 secondary to lack of assistance at home. She ultimately did well. Advancement of patient's diet and ambulation were well-tolerated. The remainder of patient's hospital course was essentially unremarkable, and discharge planning was initiated accordingly with patient safely able to be discharged home with appropriate discharge instructions, pain control, and outpatient follow-up after all of her questions were answered to her expressed satisfaction.   Discharge Condition: Good   Physical Examination:  Constitutional: alert, cooperative and no distress  Respiratory: breathing non-labored at rest  Cardiovascular: regular rate and sinus rhythm  Gastrointestinal: soft, incisional soreness, and non-distended, no rebound/guarding. Abdominal binder in place  Integumentary: Laparoscopic incisions are CDI with dermabond    Allergies as of 09/26/2024       Reactions   Ramipril Anaphylaxis, Hives   Sulfa Antibiotics    Other reaction(s): Other (See Comments)  gets yeast infections from sulfa        Medication List     TAKE these medications    amLODipine  10 MG tablet Commonly known as: NORVASC  Take 10 mg by mouth daily.   atenolol  100 MG tablet Commonly known as: TENORMIN  Take 100 mg by mouth  daily.   atorvastatin 80 MG tablet Commonly known as: LIPITOR Take 80 mg by mouth daily.   azelastine 0.1 % nasal spray Commonly known as: ASTELIN Place 1-2 sprays into both nostrils 2 (two) times daily as needed for allergies or rhinitis.   Breo Ellipta  200-25 MCG/INH Aepb Generic drug: fluticasone  furoate-vilanterol Inhale 1 puff into the lungs daily.   cyclobenzaprine 5 MG tablet Commonly known as: FLEXERIL Take 5 mg by mouth 3 (three) times daily as needed for muscle spasms.   diphenoxylate-atropine 2.5-0.025 MG tablet Commonly known as: LOMOTIL Take 1 tablet by mouth 4 (four) times daily as needed for diarrhea or loose stools.   Dulaglutide 3 MG/0.5ML Soaj Inject 3 mg into the skin once a week.   FLUoxetine  40 MG capsule Commonly known as: PROZAC  Take 40 mg by mouth daily as needed (anxiety).   hydrALAZINE  100 MG tablet Commonly known as: APRESOLINE  Take 100 mg by mouth 2 (two) times daily.   ibuprofen 800 MG tablet Commonly known as: ADVIL Take 800 mg by mouth every 8 (eight) hours as needed for moderate pain (pain score 4-6).   Januvia 100 MG tablet Generic drug: sitaGLIPtin Take 100 mg by mouth daily.   latanoprost  0.005 % ophthalmic solution Commonly known as: XALATAN  Place 1 drop into both eyes at bedtime.   levocetirizine 5 MG tablet Commonly known as: XYZAL Take 5 mg by mouth daily.   levothyroxine  200 MCG tablet Commonly known as: SYNTHROID  Take 200 mcg by mouth daily.   meclizine  25 MG tablet Commonly known as: ANTIVERT  Take 25 mg by mouth 3 (three) times daily as needed for dizziness.   meloxicam  15 MG tablet Commonly known as: MOBIC  Take 15 mg by mouth daily.  mirabegron  ER 50 MG Tb24 tablet Commonly known as: MYRBETRIQ  Take 1 tablet (50 mg total) by mouth daily.   mometasone 0.1 % lotion Commonly known as: ELOCON Apply 1 Application topically daily as needed (itchy ears).   mometasone 0.1 % cream Commonly known as: ELOCON Apply  1 Application topically daily as needed (irritation).   montelukast  10 MG tablet Commonly known as: SINGULAIR  Take 10 mg by mouth daily.   oxyCODONE  5 MG immediate release tablet Commonly known as: Oxy IR/ROXICODONE  Take 1 tablet (5 mg total) by mouth every 4 (four) hours as needed for severe pain (pain score 7-10) or breakthrough pain.   ProAir  HFA 108 (90 Base) MCG/ACT inhaler Generic drug: albuterol  Inhale 1-2 puffs into the lungs every 6 (six) hours as needed for shortness of breath or wheezing.   Simbrinza 1-0.2 % Susp Generic drug: Brinzolamide -Brimonidine  Place 2 drops into both eyes daily.   traZODone  100 MG tablet Commonly known as: DESYREL  Take 200 mg by mouth at bedtime as needed for sleep.   Vitamin D 50 MCG (2000 UT) Caps Take 2,000 Units by mouth daily.               Durable Medical Equipment  (From admission, onward)           Start     Ordered   09/26/24 1031  For home use only DME Bedside commode  Once       Question:  Patient needs a bedside commode to treat with the following condition  Answer:  Physical deconditioning   09/26/24 1030   09/26/24 1031  For home use only DME Walker rolling  Once       Question Answer Comment  Walker: With 5 Inch Wheels   Patient needs a walker to treat with the following condition Physical deconditioning      09/26/24 1030              Follow-up Information     Rachel Schanz, MD. Go on 10/10/2024.   Specialty: General Surgery Why: Go to appointment on 01/23 at 0915 AM Contact information: [address] Rachel Valenzuel[place] [address] [phone]                  Time spent on discharge management including discussion of hospital course, clinical condition, outpatient instructions, prescriptions, and follow up with the patient and members of the medical team: >30 minutes  -- Arthea Platt , PA-C [facility]  09/26/2024, 11:40 AM [phone] M-F: 7am - 4pm

## 2024-09-26 NOTE — TOC Transition Note (Signed)
 Transition of Care [facility]) - Discharge Note   Patient Details  Name: Rachel Valenzuela MRN: [id number] Date of Birth: [birth year]  [facility] CM/SW Contact:  Shasta DELENA Daring, RN Phone Number: 09/26/2024, 11:55 AM   Clinical Narrative:     Patient has discharge orders. Contacted her regarding DME recommendation. She is agreeable to rolling walker and BSC. Requested [employer] for service.  Narrative for equipment entered and signed.  Notified [employer] and confirmed receipt of orders. Equipment to be delivered to patient room.  RN notified.  RNCM signing off    Barriers to Discharge: Barriers Resolved   Patient Goals and CMS Choice            Discharge Placement                       Discharge Plan and Services Additional resources added to the After Visit Summary for                  DME Arranged: Vannie, Bedside commode DME Agency: [employer] Date DME Agency Contacted: 09/26/24 Time DME Agency Contacted: 1153 Representative spoke with at DME Agency: Thomasina            Social Drivers of Health (SDOH) Interventions SDOH Screenings   Food Insecurity: No Food Insecurity (09/24/2024)  Recent Concern: Food Insecurity - Food Insecurity Present (07/22/2024)   Received from [facility]  Housing: Low Risk (09/24/2024)  Transportation Needs: No Transportation Needs (09/24/2024)  Utilities: Not At Risk (09/24/2024)  Financial Resource Strain: Medium Risk (07/22/2024)   Received from [facility]  Tobacco Use: Low Risk (09/24/2024)  Recent Concern: Tobacco Use - Medium Risk (07/02/2024)   Received from [facility]     Readmission Risk Interventions     No data to display

## 2024-09-26 NOTE — TOC CM/SW Note (Signed)
 Commode: The patient is physically incapable of utilizing regular toilet facilities and they are confined to a single room with no toilet.   Rolling Walker: The beneficiary has a mobility limitation that significantly impairs his/her ability to participate in one or more mobility-related activities of daily living (MRADL) in the home. The patient is able to safely use the walker. The functional mobility deficit can be sufficiently resolved by use of walker.

## 2024-09-26 NOTE — Evaluation (Signed)
 Physical Therapy Evaluation Patient Details Name: Rachel Valenzuela MRN: [id number] DOB: 1960/11/27 Today's Date: 09/26/2024  History of Present Illness  64 y/o female s/p robotic assisted incarcerated ventral hernia repair with mesh on 09/24/24. PMH: T2DM, HTN, hypothyroid, asthma  Clinical Impression  Patient admitted with the above. PTA, patient lives alone and was ambulating with SPC. Patient reports history of multiple falls 2/2 BP, however denies any dizziness/lightheadedness/vision changes since being here and mobilizing. Patient presents with weakness, impaired balance, and decreased activity tolerance. Able to complete log roll modI and stand from EOB with supervision. Ambulated 150' with RW and supervision for safety with no LOB noted and denies dizziness. Patient will benefit from skilled PT services during acute stay to address listed deficits.        If plan is discharge home, recommend the following: A little help with bathing/dressing/bathroom;Assistance with cooking/housework;Assist for transportation;Help with stairs or ramp for entrance   Can travel by private vehicle        Equipment Recommendations Rolling Syla Devoss (2 wheels);BSC/3in1  Recommendations for Other Services       Functional Status Assessment Patient has had a recent decline in their functional status and demonstrates the ability to make significant improvements in function in a reasonable and predictable amount of time.     Precautions / Restrictions Precautions Precautions: Fall Recall of Precautions/Restrictions: Intact Precaution/Restrictions Comments: abdominal binder Restrictions Weight Bearing Restrictions Per Provider Order: No      Mobility  Bed Mobility Overal bed mobility: Modified Independent             General bed mobility comments: cues for log roll technique    Transfers Overall transfer level: Needs assistance Equipment used: Rolling Phelicia Dantes (2 wheels) Transfers: Sit to/from  Stand Sit to Stand: Supervision           General transfer comment: cues for hand placement    Ambulation/Gait Ambulation/Gait assistance: Supervision Gait Distance (Feet): 150 Feet Assistive device: Rolling Damilola Flamm (2 wheels) Gait Pattern/deviations: Step-through pattern, Decreased stride length Gait velocity: decreased     General Gait Details: supervision for safety. No LOB noted. Denies dizziness  Stairs            Wheelchair Mobility     Tilt Bed    Modified Rankin (Stroke Patients Only)       Balance Overall balance assessment: Mild deficits observed, not formally tested, History of Falls                                           Pertinent Vitals/Pain Pain Assessment Pain Assessment: No/denies pain    Home Living Family/patient expects to be discharged to:: Private residence Living Arrangements: Alone Available Help at Discharge: Family Type of Home: Apartment Home Access: Stairs to enter Entrance Stairs-Rails: Right;Left;Can reach both Secretary/administrator of Steps: 3   Home Layout: One level Home Equipment: Cane - single point      Prior Function Prior Level of Function : Independent/Modified Independent             Mobility Comments: uses SPC intermittently. Hx of falls       Extremity/Trunk Assessment   Upper Extremity Assessment Upper Extremity Assessment: Overall WFL for tasks assessed    Lower Extremity Assessment Lower Extremity Assessment: Generalized weakness    Cervical / Trunk Assessment Cervical / Trunk Assessment: Normal  Communication   Communication Communication: No  apparent difficulties    Cognition Arousal: Alert Behavior During Therapy: WFL for tasks assessed/performed   PT - Cognitive impairments: No apparent impairments                         Following commands: Intact       Cueing       General Comments      Exercises     Assessment/Plan    PT  Assessment Patient needs continued PT services  PT Problem List Decreased strength;Decreased balance;Decreased activity tolerance;Decreased mobility;Decreased knowledge of use of DME;Pain       PT Treatment Interventions DME instruction;Gait training;Functional mobility training;Therapeutic activities;Therapeutic exercise;Balance training;Neuromuscular re-education;Stair training;Patient/family education    PT Goals (Current goals can be found in the Care Plan section)  Acute Rehab PT Goals Patient Stated Goal: to go home PT Goal Formulation: With patient Time For Goal Achievement: 10/10/24 Potential to Achieve Goals: Good    Frequency Min 2X/week     Co-evaluation               AM-PAC PT 6 Clicks Mobility  Outcome Measure Help needed turning from your back to your side while in a flat bed without using bedrails?: None Help needed moving from lying on your back to sitting on the side of a flat bed without using bedrails?: None Help needed moving to and from a bed to a chair (including a wheelchair)?: A Little Help needed standing up from a chair using your arms (e.g., wheelchair or bedside chair)?: A Little Help needed to walk in hospital room?: A Little Help needed climbing 3-5 steps with a railing? : A Little 6 Click Score: 20    End of Session   Activity Tolerance: Patient tolerated treatment well Patient left: in bed;with call bell/phone within reach;with bed alarm set;with family/visitor present Nurse Communication: Mobility status PT Visit Diagnosis: Muscle weakness (generalized) (M62.81);Unsteadiness on feet (R26.81)    Time: 1000-1027 PT Time Calculation (min) (ACUTE ONLY): 27 min   Charges:   PT Evaluation $PT Eval Moderate Complexity: 1 Mod PT Treatments $Therapeutic Activity: 8-22 mins PT General Charges $$ ACUTE PT VISIT: 1 Visit         Maryanne Finder, PT, DPT Physical Therapist - [facility]  [facility]  Jonna Dittrich A  Starsky Nanna 09/26/2024, 10:32 AM

## 2024-09-26 NOTE — Discharge Instructions (Signed)
 In addition to included general post-operative instructions,  Diet: Resume home diet.   Activity: No heavy lifting >20 pounds (children, pets, laundry, garbage) or strenuous activity for 6 weeks, but light activity and walking are encouraged. Do not drive or drink alcohol if taking narcotic pain medications or having pain that might distract from driving. Recommend wearing abdominal binder at all times, except to shwoer, for a minimum of 4 weeks   Wound care: You may shower/get incision wet with soapy water and pat dry (do not rub incisions), but no baths or submerging incision underwater until follow-up.   Medications: Resume all home medications. For mild to moderate pain: acetaminophen  (Tylenol ) or ibuprofen/naproxen (if no kidney disease). Combining Tylenol  with alcohol can substantially increase your risk of causing liver disease. Narcotic pain medications, if prescribed, can be used for severe pain, though may cause nausea, constipation, and drowsiness. Do not combine Tylenol  and Percocet (or similar) within a 6 hour period as Percocet (and similar) contain(s) Tylenol . If you do not need the narcotic pain medication, you do not need to fill the prescription.  Call office [phone] / [phone]) at any time if any questions, worsening pain, fevers/chills, bleeding, drainage from incision site, or other concerns.

## 2024-09-29 ENCOUNTER — Telehealth: Payer: Self-pay | Admitting: Surgery

## 2024-09-29 NOTE — Telephone Encounter (Signed)
 Pt said she is wanting to know if it is ok to start back her allergy shots. She has not had them in 2 weeks. She said that she is having some swelling in her legs and wants to know if this is normal. Pt # is [phone]

## 2024-10-10 ENCOUNTER — Ambulatory Visit: Admitting: Surgery

## 2024-10-10 ENCOUNTER — Encounter: Payer: Self-pay | Admitting: Surgery

## 2024-10-10 VITALS — BP 205/110 | HR 80 | Temp 99.3°F | Ht 62.0 in | Wt 177.0 lb

## 2024-10-10 DIAGNOSIS — K436 Other and unspecified ventral hernia with obstruction, without gangrene: Secondary | ICD-10-CM | POA: Diagnosis not present

## 2024-10-10 DIAGNOSIS — Z09 Encounter for follow-up examination after completed treatment for conditions other than malignant neoplasm: Secondary | ICD-10-CM | POA: Diagnosis not present

## 2024-10-10 NOTE — Patient Instructions (Signed)

## 2024-10-10 NOTE — Progress Notes (Signed)
 " 10/10/2024  History of Present Illness: Rachel Valenzuela is a 64 y.o. female status post robotic assisted incarcerated ventral hernia repair on 09/24/2024.  Patient's defect size was overall length of 11 cm.  Patient presents today for follow-up.  She reports that she continues having soreness which has been improving overall.  She is wearing her abdominal binder which has helped her for support.  Having bowel function, denies any nausea or vomiting.  Past Medical History: Past Medical History:  Diagnosis Date   Anxiety    Asthma    Diabetes mellitus without complication (HCC)    Hyperlipidemia    Hypertension    Hypothyroid    Osteoarthritis      Past Surgical History: Past Surgical History:  Procedure Laterality Date   ENDOMETRIAL ABLATION  2011   TUBAL LIGATION     XI ROBOTIC ASSISTED VENTRAL HERNIA N/A 09/24/2024   Procedure: REPAIR, HERNIA, VENTRAL, ROBOT-ASSISTED;  Surgeon: Desiderio Schanz, MD;  Location: [facility] ORS;  Service: General;  Laterality: N/A;    Home Medications: Prior to Admission medications  Medication Sig Start Date End Date Taking? Authorizing Provider  amLODipine  (NORVASC ) 10 MG tablet Take 10 mg by mouth daily. 02/01/21  Yes [provider]  atenolol  (TENORMIN ) 100 MG tablet Take 100 mg by mouth daily. 02/01/21  Yes [provider]  atorvastatin (LIPITOR) 80 MG tablet Take 80 mg by mouth daily. 10/19/11  Yes [provider]  azelastine (ASTELIN) 0.1 % nasal spray Place 1-2 sprays into both nostrils 2 (two) times daily as needed for allergies or rhinitis. 03/03/21  Yes [provider]  BREO ELLIPTA  200-25 MCG/INH AEPB Inhale 1 puff into the lungs daily. 02/01/21  Yes [provider]  Cholecalciferol (VITAMIN D) 50 MCG (2000 UT) CAPS Take 2,000 Units by mouth daily. 11/22/20  Yes [provider]  cyclobenzaprine (FLEXERIL) 5 MG tablet Take 5 mg by mouth 3 (three) times daily as needed for muscle spasms.   Yes [provider]  diphenoxylate-atropine (LOMOTIL) 2.5-0.025 MG tablet Take 1 tablet by mouth 4 (four) times daily as needed for diarrhea or loose stools.   Yes [provider]  Dulaglutide 3 MG/0.5ML SOAJ Inject 3 mg into the skin once a week. 01/27/21  Yes [provider]  FLUoxetine  (PROZAC ) 40 MG capsule Take 40 mg by mouth daily as needed (anxiety). 11/10/20  Yes [provider]  hydrALAZINE  (APRESOLINE ) 100 MG tablet Take 100 mg by mouth 2 (two) times daily. 03/28/21  Yes [provider]  ibuprofen (ADVIL) 800 MG tablet Take 800 mg by mouth every 8 (eight) hours as needed for moderate pain (pain score 4-6).   Yes [provider]  JANUVIA 100 MG tablet Take 100 mg by mouth daily. 02/01/21  Yes [provider]  latanoprost  (XALATAN ) 0.005 % ophthalmic solution Place 1 drop into both eyes at bedtime. 10/21/20  Yes [provider]  levocetirizine (XYZAL) 5 MG tablet Take 5 mg by mouth daily. 02/01/21  Yes [provider]  levothyroxine  (SYNTHROID ) 200 MCG tablet Take 200 mcg by mouth daily. 02/01/21  Yes [provider]  meclizine  (ANTIVERT ) 25 MG tablet Take 25 mg by mouth 3 (three) times daily as needed for dizziness.   Yes [provider]  meloxicam  (MOBIC ) 15 MG tablet Take 15 mg by mouth daily. 02/02/21  Yes [provider]  mirabegron  ER (MYRBETRIQ ) 50 MG TB24 tablet Take 1 tablet (50 mg total) by mouth daily. 04/23/24  Yes  McGowan, Shannon A, PA-C  mometasone (ELOCON) 0.1 % cream Apply 1 Application topically daily as needed (irritation). 03/01/23  Yes [provider]  mometasone (ELOCON) 0.1 % lotion Apply 1 Application topically daily as needed (itchy ears). 10/03/22  Yes [provider]  montelukast  (SINGULAIR ) 10 MG tablet Take 10 mg by mouth daily. 02/01/21  Yes [provider]  PROAIR  HFA 108 (90 Base) MCG/ACT inhaler Inhale 1-2 puffs into the lungs every 6 (six) hours as  needed for shortness of breath or wheezing. 03/28/21  Yes [provider]  SIMBRINZA 1-0.2 % SUSP Place 2 drops into both eyes daily. 10/21/20  Yes [provider]  traZODone  (DESYREL ) 100 MG tablet Take 200 mg by mouth at bedtime as needed for sleep. 11/10/20  Yes [provider]    Allergies: Allergies[1]  Review of Systems: Review of Systems  Constitutional:  Negative for chills and fever.  Respiratory:  Negative for shortness of breath.   Cardiovascular:  Negative for chest pain.  Gastrointestinal:  Positive for abdominal pain. Negative for nausea and vomiting.    Physical Exam BP (!) 205/110   Pulse 80   Temp 99.3 F (37.4 C) (Oral)   Ht 5' 2 (1.575 m)   Wt 177 lb (80.3 kg)   SpO2 99%   BMI 32.37 kg/m  CONSTITUTIONAL: No acute distress HEENT:  Normocephalic, atraumatic, extraocular motion intact. RESPIRATORY:  Normal respiratory effort without pathologic use of accessory muscles. CARDIOVASCULAR: Regular rhythm and rate. GI: The abdomen is soft, nondistended, appropriate tender to palpation.  Lateral incisions are healing well are clean, dry, intact.  Dermabond still in place.  Midline area without any evidence of hernia recurrence.  However she does have an area of palpable firmness at the inferior hernia sac which is consistent with the scar tissue forming while collapsing the hernia sac.  No evidence of hernia recurrence.  NEUROLOGIC:  Motor and sensation is grossly normal.  Cranial nerves are grossly intact. PSYCH:  Alert and oriented to person, place and time. Affect is normal.   Assessment and Plan: This is a 64 y.o. female status post robotic assisted incarcerated ventral hernia repair.  - Discussed with patient that the abdominal pain is not uncommon after this surgery given the size of the defect and the amount of tissue layers that have to be pulled together.  Overall there is no evidence of hernia recurrence.  However there is a palpable  area of firmness at the inferior hernia sac which is consistent with the scar tissue forming.  At this point there is no evidence of hernia recurrence.  However I will have her follow-up with me in a month to make sure this area is improving. - Discussed with patient still activity restrictions and to continue wearing the abdominal binder. - Follow-up with me in a month.  I spent 20 minutes dedicated to the care of this patient on the date of this encounter to include pre-visit review of records, face-to-face time with the patient discussing diagnosis and management, and any post-visit coordination of care.   Aloysius Sheree Plant, MD [facility]        [1]  Allergies Allergen Reactions   Ramipril Anaphylaxis and Hives   Sulfa Antibiotics     Other reaction(s): Other (See Comments)  gets yeast infections from sulfa   "

## 2024-10-14 ENCOUNTER — Other Ambulatory Visit

## 2024-10-14 ENCOUNTER — Inpatient Hospital Stay: Admission: RE | Admit: 2024-10-14 | Source: Ambulatory Visit

## 2024-10-23 NOTE — Progress Notes (Unsigned)
 "    10/24/2024 12:14 PM   Rachel Valenzuela November 20, 1960 [id number]  Referring provider: [place] HERO, MD [address] [place],  [place] [address]  Urological history: 1. STI -trichomonas 03/2021  2. OAB -Contributing factors of age, vaginal atrophy, diabetes and artificial sweeteners -Myrbetriq  50 mg daily  3. High risk hematuria -non-smoker -CTU (11/2021) - bilateral nephrolithiasis measuring up to 7 mm on the right -cysto (11/2021) - NED  4.  Nephrolithiasis -Bilateral nephrolithiasis seen on CT urogram (11/2021)  HPI: Rachel Valenzuela is a 64 y.o. female who presents today for 6 month follow up to see if taking the Myrbetriq  consistently gave her better control over her OAB.  Previous records reviewed.   The patient reports persistent urinary leakage with both exertional activities (coughing, laughing, lifting) and urgency-associated episodes.*** Stress component: leakage with physical activity; moderate bother.*** Urgency component: sudden urge with inability to reach the bathroom in time; frequency and urgency noted.*** Nocturia: ___ times/night.*** Pads: ___ per day.*** Fluid intake: reviewed; denies excessive caffeine or bladder irritants.*** Prior treatments: pelvic floor exercises, bladder training, medications (if applicable).*** Response: partial / minimal improvement.*** Denies: dysuria, hematuria, flank pain, fever, recurrent UTIs, pelvic organ prolapse symptoms. Quality of life: symptoms significantly impact daily activities and confidence.  Serum creatinine (09/2024) 1.23   Contrast CT (10/2023) bilateral stones  PMH: Past Medical History:  Diagnosis Date   Anxiety    Asthma    Diabetes mellitus without complication (HCC)    Hyperlipidemia    Hypertension    Hypothyroid    Osteoarthritis     Surgical History: Past Surgical History:  Procedure Laterality Date   ENDOMETRIAL ABLATION  2011   TUBAL LIGATION     XI ROBOTIC ASSISTED VENTRAL HERNIA  N/A 09/24/2024   Procedure: REPAIR, HERNIA, VENTRAL, ROBOT-ASSISTED;  Surgeon: Desiderio Schanz, MD;  Location: [facility] ORS;  Service: General;  Laterality: N/A;    Home Medications:  Allergies as of 10/24/2024       Reactions   Ramipril Anaphylaxis, Hives   Sulfa Antibiotics    Other reaction(s): Other (See Comments)  gets yeast infections from sulfa        Medication List        Accurate as of October 23, 2024 12:14 PM. If you have any questions, ask your nurse or doctor.          amLODipine  10 MG tablet Commonly known as: NORVASC  Take 10 mg by mouth daily.   atenolol  100 MG tablet Commonly known as: TENORMIN  Take 100 mg by mouth daily.   atorvastatin 80 MG tablet Commonly known as: LIPITOR Take 80 mg by mouth daily.   azelastine 0.1 % nasal spray Commonly known as: ASTELIN Place 1-2 sprays into both nostrils 2 (two) times daily as needed for allergies or rhinitis.   Breo Ellipta  200-25 MCG/INH Aepb Generic drug: fluticasone  furoate-vilanterol Inhale 1 puff into the lungs daily.   cyclobenzaprine 5 MG tablet Commonly known as: FLEXERIL Take 5 mg by mouth 3 (three) times daily as needed for muscle spasms.   diphenoxylate-atropine 2.5-0.025 MG tablet Commonly known as: LOMOTIL Take 1 tablet by mouth 4 (four) times daily as needed for diarrhea or loose stools.   Dulaglutide 3 MG/0.5ML Soaj Inject 3 mg into the skin once a week.   FLUoxetine  40 MG capsule Commonly known as: PROZAC  Take 40 mg by mouth daily as needed (anxiety).   hydrALAZINE  100 MG tablet Commonly known as: APRESOLINE  Take 100 mg by mouth 2 (  two) times daily.   ibuprofen 800 MG tablet Commonly known as: ADVIL Take 800 mg by mouth every 8 (eight) hours as needed for moderate pain (pain score 4-6).   Januvia 100 MG tablet Generic drug: sitaGLIPtin Take 100 mg by mouth daily.   latanoprost  0.005 % ophthalmic solution Commonly known as: XALATAN  Place 1 drop into both eyes at bedtime.    levocetirizine 5 MG tablet Commonly known as: XYZAL Take 5 mg by mouth daily.   levothyroxine  200 MCG tablet Commonly known as: SYNTHROID  Take 200 mcg by mouth daily.   meclizine  25 MG tablet Commonly known as: ANTIVERT  Take 25 mg by mouth 3 (three) times daily as needed for dizziness.   meloxicam  15 MG tablet Commonly known as: MOBIC  Take 15 mg by mouth daily.   mirabegron  ER 50 MG Tb24 tablet Commonly known as: MYRBETRIQ  Take 1 tablet (50 mg total) by mouth daily.   mometasone 0.1 % lotion Commonly known as: ELOCON Apply 1 Application topically daily as needed (itchy ears).   mometasone 0.1 % cream Commonly known as: ELOCON Apply 1 Application topically daily as needed (irritation).   montelukast  10 MG tablet Commonly known as: SINGULAIR  Take 10 mg by mouth daily.   ProAir  HFA 108 (90 Base) MCG/ACT inhaler Generic drug: albuterol  Inhale 1-2 puffs into the lungs every 6 (six) hours as needed for shortness of breath or wheezing.   Simbrinza 1-0.2 % Susp Generic drug: Brinzolamide -Brimonidine  Place 2 drops into both eyes daily.   traZODone  100 MG tablet Commonly known as: DESYREL  Take 200 mg by mouth at bedtime as needed for sleep.   Vitamin D 50 MCG (2000 UT) Caps Take 2,000 Units by mouth daily.        Allergies:  Allergies  Allergen Reactions   Ramipril Anaphylaxis and Hives   Sulfa Antibiotics     Other reaction(s): Other (See Comments)  gets yeast infections from sulfa    Family History: Family History  Problem Relation Age of Onset   Breast cancer Neg Hx     Social History:  reports that she has never smoked. She has never been exposed to tobacco smoke. She has never used smokeless tobacco. She reports that she does not drink alcohol and does not use drugs.  ROS: Pertinent ROS in HPI  Physical Exam: There were no vitals taken for this visit.  Constitutional:  Well nourished. Alert and oriented, No acute distress. HEENT: [place] AT, moist  mucus membranes.  Trachea midline, no masses. Cardiovascular: No clubbing, cyanosis, or edema. Respiratory: Normal respiratory effort, no increased work of breathing. GU: No CVA tenderness.  No bladder fullness or masses.  Recession of labia minora, dry, pale vulvar vaginal mucosa and loss of mucosal ridges and folds.  Normal urethral meatus, no lesions, no prolapse, no discharge.   No urethral masses, tenderness and/or tenderness. No bladder fullness, tenderness or masses. *** vagina mucosa, *** estrogen effect, no discharge, no lesions, *** pelvic support, *** cystocele and *** rectocele noted.  No cervical motion tenderness.  Uterus is freely mobile and non-fixed.  No adnexal/parametria masses or tenderness noted.  Anus and perineum are without rashes or lesions.   ***  Neurologic: Grossly intact, no focal deficits, moving all 4 extremities. Psychiatric: Normal mood and affect.     Laboratory Data: See HPI and EPIC I have reviewed the labs.  See HPI.     Pertinent Imaging: N/A  Assessment & Plan:    1. High risk hematuria -non-smoker -work up  02/2022 - nephrolithiasis -no reports of gross heme  2. OAB - Behavioral: Initiate bladder retraining and timed voiding and pelvic floor physical therapy referral for Kegel exercises and biofeedback *** - Pharmacologic: Trial of mirabegron  25 mg daily for urgency component *** and vaginal estrogen cream (estradiol 0.01%) 2x/week for urogenital atrophy - Follow-up: Return in 8-12 weeks to assess response to therapy, consider urodynamic testing if symptoms persist or worsen *** - refer to discuss surgical options (e.g., midurethral sling) if stress incontinence remains bothersome despite conservative measures *** - Education: Discussed nature of mixed incontinence, treatment options, and expected outcomes and patient expressed understanding and interest in conservative management initially  - Clinically consistent with both stress and urgency  components; persistent symptoms despite conservative measures.*** - Overactive bladder component - urgency, frequency, urge leakage.*** - Stress urinary incontinence component - leakage with exertion; urethral hypermobility may contribute.*** - Medication review and behavioral strategies - ongoing need for optimization.*** - Further evaluation - may benefit from urodynamics if symptoms remain refractory.*** - Reviewed pathophysiology of stress vs urgency components.*** - Reinforced behavioral strategies: timed voiding, fluid management, avoidance of bladder irritants.*** - Continue or initiate pelvic floor physical therapy; referral placed if not already engaged  *** - Discussed medication options (antimuscarinic or ?-3 agonist) and expected benefits/side effects.*** - Continue / adjust current therapy based on symptom response.*** - Consider third-line therapies (PTNS, Botox, sacral neuromodulation) if inadequate improvement.*** - Reviewed options including pessary support, pelvic floor therapy, and surgical interventions (mid-urethral sling) if symptoms remain bothersome.*** - UA and PVR reviewed today.*** - Consider urodynamic testing if symptoms persist or if surgical planning is pursued.*** - Discussed realistic expectations, treatment sequencing, and shared decision-making.*** - Encouraged adherence to pelvic floor therapy and bladder training.***   3. Nephrolithiasis -  stable renal calculus - asymptomatic, no evidence of obstruction or infection. - Patient preference for conservative management - appropriate given stability and lack of symptoms. Plan: - Reviewed natural history of renal stones, including potential for future growth, migration, or symptomatic episodes. - Discussed treatment options (ESWL, ureteroscopy, PCNL) but patient declines definitive intervention at this time. - Continue active surveillance. - Encourage hydration and dietary stone-prevention measures as  appropriate for stone type (if known). - Repeat imaging in 12 months to monitor for growth or change. - Instructed patient to report any new flank pain, hematuria, fever, nausea/vomiting, or urinary symptoms. - Follow-up scheduled for routine surveillance or sooner if symptoms arise.  No follow-ups on file.  These notes generated with voice recognition software. I apologize for typographical errors.  Rachel Valenzuela  [facility] [address]  [address] [place], [place] [address] [phone]   "

## 2024-10-24 ENCOUNTER — Ambulatory Visit: Admitting: Urology

## 2024-10-24 VITALS — BP 109/70 | HR 69 | Wt 177.0 lb

## 2024-10-24 DIAGNOSIS — N200 Calculus of kidney: Secondary | ICD-10-CM

## 2024-10-24 DIAGNOSIS — N3281 Overactive bladder: Secondary | ICD-10-CM

## 2024-10-24 DIAGNOSIS — R319 Hematuria, unspecified: Secondary | ICD-10-CM

## 2024-10-24 NOTE — Patient Instructions (Signed)
 Before your appointment next year, about 30 minutes prior to your appointment, go to the medical mall registration desk to check in for you Xray

## 2024-10-29 ENCOUNTER — Encounter

## 2024-10-29 ENCOUNTER — Other Ambulatory Visit

## 2024-11-10 ENCOUNTER — Encounter: Admitting: Surgery

## 2025-10-26 ENCOUNTER — Ambulatory Visit: Admitting: Urology
